# Patient Record
Sex: Female | Born: 1965 | Race: White | Hispanic: Yes | Marital: Single | Smoking: Never smoker
Health system: Southern US, Community
[De-identification: ages and names within clinical notes are randomized; demographics above are authoritative.]

## PROBLEM LIST (undated history)

## (undated) DIAGNOSIS — F329 Major depressive disorder, single episode, unspecified: Secondary | ICD-10-CM

## (undated) DIAGNOSIS — K219 Gastro-esophageal reflux disease without esophagitis: Secondary | ICD-10-CM

## (undated) DIAGNOSIS — E785 Hyperlipidemia, unspecified: Secondary | ICD-10-CM

## (undated) DIAGNOSIS — F32A Depression, unspecified: Secondary | ICD-10-CM

## (undated) DIAGNOSIS — R339 Retention of urine, unspecified: Secondary | ICD-10-CM

## (undated) HISTORY — PX: CARPAL TUNNEL RELEASE: SHX101

---

## 2005-11-09 HISTORY — PX: ABDOMINAL HYSTERECTOMY: SHX81

## 2013-05-17 ENCOUNTER — Encounter (HOSPITAL_COMMUNITY): Payer: Self-pay

## 2013-05-28 ENCOUNTER — Encounter (HOSPITAL_COMMUNITY): Payer: Self-pay

## 2013-05-28 ENCOUNTER — Ambulatory Visit (HOSPITAL_COMMUNITY)
Admission: RE | Admit: 2013-05-28 | Discharge: 2013-05-28 | Disposition: A | Discharge status: Home / Self Care | Payer: Self-pay | Source: Ambulatory Visit | Attending: Obstetrics and Gynecology | Admitting: Obstetrics and Gynecology

## 2013-05-28 ENCOUNTER — Encounter (INDEPENDENT_AMBULATORY_CARE_PROVIDER_SITE_OTHER): Payer: Self-pay

## 2013-05-28 VITALS — BP 110/76 | Temp 98.5°F | Ht 66.0 in | Wt 173.4 lb

## 2013-05-28 DIAGNOSIS — N6325 Unspecified lump in the left breast, overlapping quadrants: Secondary | ICD-10-CM | POA: Insufficient documentation

## 2013-05-28 DIAGNOSIS — Z1239 Encounter for other screening for malignant neoplasm of breast: Secondary | ICD-10-CM

## 2013-05-28 DIAGNOSIS — N6320 Unspecified lump in the left breast, unspecified quadrant: Secondary | ICD-10-CM

## 2013-05-28 NOTE — Progress Notes (Signed)
Patient referred to [facility] by [facility] due to recommending a left breast biopsy.  Pap Smear:  Pap smear not completed today. Last Pap smear was 11/09/2005 and normal. Per patient has no history of an abnormal Pap smear. Patient has a history of a hysterectomy for DUB. Patient does not need any further Pap smears due to her history of a hysterectomy for benign reasons per ACOG and BCCCP guidelines. No Pap smear results in EPIC.  Physical exam: Breasts Breasts symmetrical. No skin abnormalities bilateral breasts. No nipple retraction bilateral breasts. No nipple discharge bilateral breasts. No lymphadenopathy. No lumps palpated right breast. Palpated a lump within the left breast at 12 o'clock above the areola. No complaints of pain or tenderness on exam. Referred patient to [facility] for a left breast biopsy per recommendation. Appointment scheduled for Wednesday, May 29, 2013 at 1300.    Pelvic/Bimanual No Pap smear completed today since patient has a history of a hysterectomy for benign reasons. Pap smear not indicated per BCCCP guidelines.

## 2013-05-28 NOTE — Patient Instructions (Signed)
Taught Heidi MuttonDanelia Compton how to perform BSE and gave educational materials to take home. Patient did not need a Pap smear today due to a history of a hysterectomy for benign reasons. Told patient that she does not need any further Pap smears due to her history of a hysterectomy for benign reasons. Referred [facility] to [facility] for a left breast biopsy per recommendation. Appointment scheduled for Wednesday, May 29, 2013 at 1300. Patient aware of appointment and will be there. Heidi Compton verbalized understanding.  [name], RN 4:08 PM

## 2013-05-29 ENCOUNTER — Other Ambulatory Visit: Payer: Self-pay | Admitting: Radiology

## 2013-05-31 ENCOUNTER — Other Ambulatory Visit: Payer: Self-pay

## 2013-05-31 ENCOUNTER — Ambulatory Visit (HOSPITAL_BASED_OUTPATIENT_CLINIC_OR_DEPARTMENT_OTHER): Payer: Self-pay

## 2013-05-31 VITALS — BP 110/74 | HR 68 | Temp 98.1°F | Resp 14 | Ht 65.0 in | Wt 171.2 lb

## 2013-05-31 DIAGNOSIS — Z0000 Encounter for general adult medical examination without abnormal findings: Secondary | ICD-10-CM

## 2013-05-31 LAB — LIPID PANEL
CHOL/HDL RATIO: 4.6 ratio
CHOLESTEROL: 187 mg/dL (ref 0–200)
HDL: 41 mg/dL (ref >39)
LDL Cholesterol: 122 mg/dL — ABNORMAL HIGH (ref 0–99)
Triglycerides: 118 mg/dL (ref <150)
VLDL: 24 mg/dL (ref 0–40)

## 2013-05-31 LAB — HEMOGLOBIN A1C
Hgb A1c MFr Bld: 5.6 % (ref <5.7)
Mean Plasma Glucose: 114 mg/dL (ref <117)

## 2013-05-31 LAB — GLUCOSE (CC13): Glucose: 101 mg/dl (ref 70–140)

## 2013-05-31 NOTE — Progress Notes (Signed)
Patient is a new patient to the [facility]woman program and is currently a BCCCP patient effective 05/28/2013   Clinical Measurements: Patient is 5 ft. 5 inches, weight 171.2 lbs, waist circumference 34 inches, and hip circumference 41 inches.   Medical History: Patient has no history of high cholesterolor diabetes . Patient does have a history of hypertension but at the present time it is controlled by behavior modification . Per patient no diagnosed history of coronary heart disease, heart attack, heart failure, stroke/TIA, vascular disease or congenital heart defects.  Blood Pressure, Self-measurement: Patient states daughter checks patients Blood pressure a few times a week.  Nutrition Assessment: Patient stated that eats one fruit every day. Patient states she eats at least 1 servings of vegetables a day. Patient does not eats 3 or more ounces of whole grains daily. Patient doesn't eat two or more servings of fish weekly.  Patient states she does drink more than 36 ounces or 450 calories of beverages with added sugars weekly. Patient states she drinks two sodas a day. Patient stated she does not watch her salt intake.  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 Patient does approximately 780 minutes of moderate physical activity a week. Per patient does 240 minutes a week of vigorous activity a week which include cardio exercising and dancing.  Smoking Status: Patient has never smoked. Patient has no  exposure to smoke   Quality of Life Assessment: In assessing patient's quality of life she stated that out of the past 30 days that she has felt her health is good all of them. Patient also stated that in the past 30 days that her mental health is not good including stress, depression and problems with emotions for 15 days. Patient did state that out of the past 30 days she felt her physical or mental health had not kept  her from doing her usual activities including self-care, work or recreation.   Plan: Lab work will be done today including a lipid panel, blood glucose, and Hgb A1C. Will call lab results on next week. Patient  Will call or go to [facility] for counseling and medication for depression.

## 2013-05-31 NOTE — Patient Instructions (Signed)
Discussed health assessment with patient. She will be called with results of lab work and we will then discuss date for [employer].Will contact [facility] concerning depression.  Patient verbalized understanding.

## 2013-06-04 ENCOUNTER — Telehealth: Payer: Self-pay

## 2013-06-04 ENCOUNTER — Encounter (HOSPITAL_COMMUNITY): Payer: Self-pay

## 2013-06-04 NOTE — Telephone Encounter (Signed)
Told to return call and I would call back later to give lab results.

## 2013-10-31 ENCOUNTER — Telehealth (HOSPITAL_COMMUNITY): Payer: Self-pay | Admitting: *Deleted

## 2013-10-31 NOTE — Telephone Encounter (Signed)
Telephoned patient and advised it was time to schedule [facility] with [facility]. Will schedule appointment and call patient back. Used interpreter Delorise RoyalsJulie Sowell.

## 2013-11-11 ENCOUNTER — Encounter (HOSPITAL_COMMUNITY): Payer: Self-pay

## 2013-12-17 ENCOUNTER — Encounter (HOSPITAL_COMMUNITY): Payer: Self-pay | Admitting: *Deleted

## 2014-08-04 ENCOUNTER — Telehealth: Payer: Self-pay

## 2014-08-04 NOTE — Telephone Encounter (Signed)
Called to see if BCCCP card was still valid because patient stated that had scheduled a mammogram at breast center. Informed that card expired in May and would need new one to pay for mammogram. Patient stated that was not having any problems with breast. I explained that we were not doing routine screenings until our provider returned from medical leave. I would put her on a list for Sabrina to call when a screening appointment is available.

## 2014-08-21 ENCOUNTER — Other Ambulatory Visit (HOSPITAL_COMMUNITY): Payer: Self-pay | Admitting: *Deleted

## 2014-08-26 ENCOUNTER — Ambulatory Visit (HOSPITAL_COMMUNITY)
Admission: RE | Admit: 2014-08-26 | Discharge: 2014-08-26 | Disposition: A | Discharge status: Home / Self Care | Payer: Self-pay | Source: Ambulatory Visit | Attending: Obstetrics and Gynecology | Admitting: Obstetrics and Gynecology

## 2014-08-26 ENCOUNTER — Encounter (HOSPITAL_COMMUNITY): Payer: Self-pay

## 2014-08-26 VITALS — BP 114/72 | Temp 98.0°F | Ht 66.0 in | Wt 180.0 lb

## 2014-08-26 DIAGNOSIS — N6320 Unspecified lump in the left breast, unspecified quadrant: Secondary | ICD-10-CM

## 2014-08-26 HISTORY — DX: Gastro-esophageal reflux disease without esophagitis: K21.9

## 2014-08-26 HISTORY — DX: Major depressive disorder, single episode, unspecified: F32.9

## 2014-08-26 HISTORY — DX: Depression, unspecified: F32.A

## 2014-08-26 NOTE — Progress Notes (Signed)
CLINIC:   Breast & Cervical Cancer Control Program (BCCCP) Clinic  REASON FOR VISIT: Well-woman exam with routine gynecological exam  HISTORY OF PRESENT ILLNESS:   Heidi Compton is a 49 y.o. female [facility]. Accompanied by interpreter, Heidi Compton. Her last mammogram was in May 2015 which showed fibrocystic changes. Had a breast ultrasound in November 2015 which was negative. Her last pap smear was in October 2007 and was negative. No history of abnormal pap. 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    REVIEW OF SYSTEMS:   Has pain to her right breast intermittently. Denies nodularity, skin changes, nipple inversion, or nipple discharge bilaterally.  Denies any pelvic pain, pressure, or abnormal vaginal bleeding.   ALLERGIES: No Known Allergies   MEDICATIONS:  Current outpatient prescriptions:  .  lansoprazole (PREVACID) 15 MG capsule, Take 15 mg by mouth daily at 12 noon., Disp: , Rfl:  .  naproxen sodium (ANAPROX) 220 MG tablet, Take 220 mg by mouth 2 (two) times daily with a meal., Disp: , Rfl:    PHYSICAL EXAM:   Patient Vitals for the past 24 hrs:  BP Temp Temp src Height Weight  08/26/14 0855 114/72 mmHg 98 F (36.7 C) Oral  (1.676 m) 180 lb (81.647 kg)   General: Well-nourished, well-appearing female in no acute distress.  She is accompanied by interpreter today.  Stoney Bang, LPN was present during physical exam for this patient.   Breasts: Bilateral breasts exposed and observed with patient standing (arms at side, arms on hips, arms on hips flexed forward, and arms over head).  No gross abnormalities including breast skin puckering or dimpling noted on observation.  Breasts symmetrical without evidence of skin redness, thickening, or peau d'orange appearance. No nipple retraction or nipple discharge noted bilaterally.  No breast nodularity palpated in bilateral breasts.  Axillary lymph nodes: No  axillary lymphadenopathy bilaterally.      ASSESSMENT & PLAN:   1. Breast cancer screening: Heidi Compton has no palpable breast abnormalities on her clinical breast exam today.  She will receive her diagnostic mammogram as scheduled.  She will be contacted by [facility] for results of the mammogram. She was given instructions and educational materials regarding breast self-awareness. Heidi Compton is aware of this plan and agrees with it.   2. Cervical cancer screening: Heidi Compton is s/p hysterectomy. Cervical cancer screening not indicated.     Heidi Compton was encouraged to ask questions and all questions were answered to her satisfaction.      Clenton Pare, DNP, AGPCNP-BC, [facility] [phone]

## 2014-10-29 ENCOUNTER — Encounter (HOSPITAL_COMMUNITY): Payer: Self-pay | Admitting: *Deleted

## 2014-11-26 NOTE — Addendum Note (Signed)
Encounter addended by: Priscille Heidelberghristine P Lakeya Mulka, RN on: 11/26/2014  4:18 PM<BR>     Documentation filed: Charges VN

## 2015-01-11 HISTORY — PX: BREAST BIOPSY: SHX20

## 2015-08-17 ENCOUNTER — Telehealth (HOSPITAL_COMMUNITY): Payer: Self-pay | Admitting: *Deleted

## 2015-08-17 NOTE — Telephone Encounter (Signed)
Patient's daughter called to schedule patient appointment with BCCCP. Called patient back with Spanish interpreter Raquel Mora and left voicemail for her to call back. Patient called me back. Explained BCCCP to patient and verified eligibility. Scheduled patient appointment with BCCCP for Thursday, September 10, 2015 at 1330. Patient verbalized understanding.

## 2015-09-09 ENCOUNTER — Telehealth (HOSPITAL_COMMUNITY): Payer: Self-pay | Admitting: *Deleted

## 2015-09-09 NOTE — Telephone Encounter (Signed)
Telephoned patient at home number and confirmed BCCCP appointment for Thursday August 31 1:30. Used interpreter Delorise RoyalsJulie Sowell.

## 2015-09-10 ENCOUNTER — Ambulatory Visit (HOSPITAL_COMMUNITY)
Admission: RE | Admit: 2015-09-10 | Discharge: 2015-09-10 | Disposition: A | Discharge status: Home / Self Care | Payer: Self-pay | Source: Ambulatory Visit | Attending: Obstetrics and Gynecology | Admitting: Obstetrics and Gynecology

## 2015-09-10 ENCOUNTER — Encounter (HOSPITAL_COMMUNITY): Payer: Self-pay

## 2015-09-10 VITALS — BP 104/64 | Temp 98.0°F | Ht 65.0 in | Wt 182.4 lb

## 2015-09-10 DIAGNOSIS — N644 Mastodynia: Secondary | ICD-10-CM

## 2015-09-10 DIAGNOSIS — Z1239 Encounter for other screening for malignant neoplasm of breast: Secondary | ICD-10-CM

## 2015-09-10 NOTE — Progress Notes (Signed)
bComplaints of bilateral breast pain x 6-7 months that is constant. Patient rates pain at a 9 out of 10. Patient complained of bilateral breasts feeling swollen at times lasting 1-1.5 weeks.  Pap Smear:  Pap smear not completed today. Last Pap smear was 11/09/2005 and normal. Per patient has no history of an abnormal Pap smear. Patient has a history of a hysterectomy in 2007 for DUB. No further Pap smears are recommended due to her history of a hysterectomy for benign reasons per BCCCP and ACOG guidelines.Last Pap smear result is in EPIC.    Physical exam: Breasts Breasts symmetrical. No skin abnormalities bilateral breasts. No nipple retraction bilateral breasts. No nipple discharge bilateral breasts. No lymphadenopathy. No lumps palpated bilateral breasts. Complaints of bilateral breast tenderness around 12 o'clock on exam. [facility]. Appointment scheduled for Thursday, September 10, 2015 at 1500.        Pelvic/Bimanual No Pap smear completed today since patient has a history of a hysterectomy for benign reasons. Pap smear not indicated per BCCCP guidelines.   Smoking History: Patient has never smoked.  Patient Navigation: Patient education provided. Access to services provided for [facility] through [facility]. Spanish interpreter provided.  Colorectal Cancer Screening: Per patient had a colonoscopy completed 15 years ago. Informed patient that it is time to have another one completed since it has been greater than 10 years. No complaints today.  Used Spanish interpreter [employer] Mora from [facility].

## 2015-09-10 NOTE — Patient Instructions (Signed)
Explained breast self awareness to Heidi Compton. Patient did not need a Pap smear today due to her history of a hysterectomy for benign reasons. Let patient know that she doesn't need any further Pap smears due to her history of a hysterectomy for benign reasons. [facility] mamm[facility]. Appointment scheduled for Thursday, September 10, 2015 at 1500. Heidi Muttonanelia Petruzzi verbalized understanding.  Eyanna Mcgonagle, Kathaleen Maserhristine Poll, RN 9:12 PM

## 2015-09-11 ENCOUNTER — Encounter (HOSPITAL_COMMUNITY): Payer: Self-pay | Admitting: *Deleted

## 2015-09-22 ENCOUNTER — Encounter (HOSPITAL_COMMUNITY): Payer: Self-pay | Admitting: *Deleted

## 2016-09-20 ENCOUNTER — Encounter (HOSPITAL_COMMUNITY): Payer: Self-pay

## 2016-09-20 ENCOUNTER — Ambulatory Visit (HOSPITAL_COMMUNITY)
Admission: RE | Admit: 2016-09-20 | Discharge: 2016-09-20 | Disposition: A | Discharge status: Home / Self Care | Payer: Self-pay | Source: Ambulatory Visit | Attending: Obstetrics and Gynecology | Admitting: Obstetrics and Gynecology

## 2016-09-20 VITALS — BP 123/82 | HR 68 | Temp 98.3°F | Ht 66.0 in | Wt 176.1 lb

## 2016-09-20 DIAGNOSIS — N644 Mastodynia: Secondary | ICD-10-CM

## 2016-09-20 DIAGNOSIS — Z1239 Encounter for other screening for malignant neoplasm of breast: Secondary | ICD-10-CM

## 2016-09-20 NOTE — Patient Instructions (Addendum)
Explained breast self awareness to Heidi Compton. Patient did not need a Pap smear today due to her history of a hysterectomy for benign reasons. Let patient know that she doesn't need any further Pap smears due to her history of a hysterectomy for benign reasons. [facility] for a screening mammogram due to no new concerns since last mammogram 09/10/2015. Appointment scheduled for Tuesday, September 20, 2016 at 1000.  Heidi Muttonanelia Riggle verbalized understanding.  Francetta Ilg, Kathaleen Maserhristine Poll, RN 11:16 AM

## 2016-09-20 NOTE — Progress Notes (Signed)
Complaints of bilateral breast pain x 1.5 years that is constant. Patient rates pain at a 8 out of 10.   Pap Smear: Pap smear not completed today. Last Pap smear was 11/09/2005 and normal. Per patient has no history of an abnormal Pap smear. Patient has a history of a hysterectomy in 2007 for DUB. No further Pap smears are recommended due to her history of a hysterectomy for benign reasons per BCCCP and ACOG guidelines.Last Pap smear result is in EPIC.  Physical exam: Breasts Breasts symmetrical. No skin abnormalities bilateral breasts. No nipple retraction bilateral breasts. No nipple discharge bilateral breasts. No lymphadenopathy. No lumps palpated bilateral breasts. Complaints of bilateral breast tenderness around 12 o'clock on exam. [facility] for a screening mammogram due to no new concerns since last mammogram 09/10/2015. Appointment scheduled for Tuesday, September 20, 2016 at 1000.           Pelvic/Bimanual No Pap smear completed today since patient has a history of a hysterectomy for benign reasons. Pap smear not indicated per BCCCP guidelines.   Smoking History: Patient has never smoked.  Patient Navigation: Patient education provided. Access to services provided for [facility] through [facility]. Spanish interpreter provided.  Colorectal Cancer Screening: Per patient had a colonoscopy completed 15 years ago. No complaints today. FIT Test given to patient to complete and return to BCCCP.  Used Spanish interpreter [employer]anca Lindner from [place].

## 2016-12-22 ENCOUNTER — Telehealth (HOSPITAL_COMMUNITY): Payer: Self-pay

## 2016-12-22 NOTE — Telephone Encounter (Signed)
Patient's daughter Donata ClayKarina called about the FIT Test letter that we sent the patient in November. I explained to the patient's daughter that the letter was a reminder letter to complete and return the FIT test. Patient has lost the first FIT Test that was given to her in BCCCP. I let her know that I would mail a new one to her to complete. Patient's daughter voiced understanding and said that she would explain this to her mother.

## 2017-01-02 ENCOUNTER — Other Ambulatory Visit: Payer: Self-pay | Admitting: Obstetrics and Gynecology

## 2017-01-13 LAB — FECAL OCCULT BLOOD, IMMUNOCHEMICAL: FECAL OCCULT BLD: NEGATIVE

## 2017-07-28 ENCOUNTER — Other Ambulatory Visit (HOSPITAL_COMMUNITY): Payer: Self-pay | Admitting: *Deleted

## 2017-07-28 DIAGNOSIS — N644 Mastodynia: Secondary | ICD-10-CM

## 2017-09-21 ENCOUNTER — Ambulatory Visit (HOSPITAL_COMMUNITY)
Admission: RE | Admit: 2017-09-21 | Discharge: 2017-09-21 | Disposition: A | Discharge status: Home / Self Care | Payer: Self-pay | Source: Ambulatory Visit | Attending: Obstetrics and Gynecology | Admitting: Obstetrics and Gynecology

## 2017-09-21 ENCOUNTER — Encounter (HOSPITAL_COMMUNITY): Payer: Self-pay

## 2017-09-21 ENCOUNTER — Ambulatory Visit
Admission: RE | Admit: 2017-09-21 | Discharge: 2017-09-21 | Disposition: A | Discharge status: Home / Self Care | Payer: No Typology Code available for payment source | Source: Ambulatory Visit | Attending: Obstetrics and Gynecology | Admitting: Obstetrics and Gynecology

## 2017-09-21 ENCOUNTER — Ambulatory Visit: Payer: No Typology Code available for payment source

## 2017-09-21 ENCOUNTER — Other Ambulatory Visit (HOSPITAL_COMMUNITY): Payer: Self-pay | Admitting: Obstetrics and Gynecology

## 2017-09-21 VITALS — BP 124/86

## 2017-09-21 DIAGNOSIS — Z1239 Encounter for other screening for malignant neoplasm of breast: Secondary | ICD-10-CM

## 2017-09-21 DIAGNOSIS — Z1231 Encounter for screening mammogram for malignant neoplasm of breast: Secondary | ICD-10-CM

## 2017-09-21 DIAGNOSIS — N644 Mastodynia: Secondary | ICD-10-CM

## 2017-09-21 DIAGNOSIS — N6321 Unspecified lump in the left breast, upper outer quadrant: Secondary | ICD-10-CM

## 2017-09-21 NOTE — Patient Instructions (Signed)
Explained breast self awareness to Heidi Compton. Patient did not need a Pap smear today due to her history of a hysterectomy for benign reasons. Let patient know that she doesn't need any further Pap smears due to her history of a hysterectomy for benign reasons. [facility] for a diagnostic mamm[facility] and possible left breast ultrasound. Appointment scheduled for Thursday, September 21, 2017 at 1050.  Heidi Compton verbalized understanding.  Brannock, Kathaleen Maserhristine Poll, RN 9:29 AM

## 2017-09-21 NOTE — Progress Notes (Signed)
Complaints of left breast lump x 3 years that has increased in size. Patient complained of bilateral diffuse breast pain x 2 years that comes and goes. Patient rates the pain at a 7 out of 10.  Pap Smear: Pap smear not completed today. Last Pap smear was 11/09/2005 and normal. Per patient has no history of an abnormal Pap smear. Patient has a history of a hysterectomy in 2007 for DUB. No further Pap smears are recommended due to her history of a hysterectomy for benign reasons per BCCCP and ACOG guidelines. Last Pap smear result is in EPIC.  Physical exam: Breasts Breasts symmetrical. No skin abnormalities bilateral breasts. No nipple retraction bilateral breasts. No nipple discharge bilateral breasts. No lymphadenopathy. No lumps palpated right breast. Palpated a lump within the left breast at 1 o'clock 4 cm from the nipple. No complaints of pain or tenderness on exam. [facility] for a diagnostic mammogram and possible left breast ultrasound. Appointment scheduled for Thursday, September 21, 2017 at 1050.         Pelvic/Bimanual No Pap smear completed today since patient has a history of a hysterectomy for benign reasons. Pap smear not indicated per BCCCP guidelines.   Smoking History: Patient has never smoked.  Patient Navigation: Patient education provided. Access to services provided for [facility]. Spanish interpreter provided.   Colorectal Cancer Screening: Per patient had a colonoscopy completed 16 years ago. FIT Test completed 01/02/2017 that was negative. No complaints today.   Breast and Cervical Cancer Risk Assessment: Patient has no family history of breast cancer, known genetic mutations, or radiation treatment to the chest before age 53. Patient has no history of cervical dysplasia, immunocompromised, or DES exposure in-utero.  Risk Assessment    Risk Scores      09/21/2017   Last edited by: Lynnell DikeHolland, Sabrina H, LPN   5-year risk: 0.6 %   Lifetime risk: 4.7 %         Used Spanish interpreter Natale LayErika McReynolds from [facility].

## 2017-09-22 ENCOUNTER — Encounter (HOSPITAL_COMMUNITY): Payer: Self-pay | Admitting: *Deleted

## 2017-09-29 ENCOUNTER — Other Ambulatory Visit (HOSPITAL_COMMUNITY): Payer: Self-pay | Admitting: *Deleted

## 2017-09-29 ENCOUNTER — Inpatient Hospital Stay: Payer: No Typology Code available for payment source

## 2017-09-29 ENCOUNTER — Inpatient Hospital Stay: Payer: No Typology Code available for payment source | Attending: Obstetrics and Gynecology

## 2017-09-29 DIAGNOSIS — Z0000 Encounter for general adult medical examination without abnormal findings: Secondary | ICD-10-CM | POA: Insufficient documentation

## 2017-09-29 LAB — LIPID PANEL
Cholesterol: 232 mg/dL — ABNORMAL HIGH (ref 0–200)
HDL: 45 mg/dL (ref >40)
LDL CALC: 165 mg/dL — AB (ref 0–99)
TRIGLYCERIDES: 112 mg/dL (ref <150)
Total CHOL/HDL Ratio: 5.2 RATIO
VLDL: 22 mg/dL (ref 0–40)

## 2017-09-29 LAB — HEMOGLOBIN A1C
Hgb A1c MFr Bld: 6 % — ABNORMAL HIGH (ref 4.8–5.6)
Mean Plasma Glucose: 125.5 mg/dL

## 2017-09-29 NOTE — Progress Notes (Unsigned)
Wisewoman initial screening  Spanish interpreter- Grayce SessionsBlanca Linder,from [facility]  Clinical Measurement:  Height: 65in Weight: 176lb  Blood Pressure: 125/72  Blood Pressure #2: 110/70  Fasting Labs Drawn Today, will review with patient when they result.  Medical History:  Patient states that she has not been diagnosed with high cholesterol, high blood pressure, diabetes or heart disease.  Medications:  Patients states she is not taking any medications for high cholesterol, high blood pressure or diabetes.  She is not taking aspirin daily to prevent heart attack or stroke.    Blood pressure, self measurement:  Patients states she does measure blood pressure at home once per month.  Nutrition:  Patient states she eats 2 cups of fruit and 0.5cups of vegetables in an average day.  Patient states she does not eat fish regularly, she eats about a  half a serving of whole grains daily. She drinks less than 36 ounces of beverages with added sugar weekly.  She is currently watching her sodium intake.  She has not had any drinks containing alcohol in the last seven days.     Physical activity:  Patient states that she gets 210 minutes of moderate exercise in a week.  She gets 0 minutes of vigorous exercise per week.    Smoking status:  Patient states she has never smoked and is not around any smokers.    Quality of life:  Patient states that she has had 0 bad physical days out of the last 30 days. In the last 2 weeks, she has had a few days that she has felt down or depressed. She has had 0 days in the last 2 weeks that she has had little interest or pleasure in doing things.  Risk reduction and counseling:  Patient states she wants to lose weight and increase fruit and vegetable intake.  I encouraged her to continue with current exercise regimen and increase vegetable and fruit intake.  Navigation:  I will notify patient of lab results.  Patient is aware of 2 more health coaching sessions and a follow up.

## 2017-09-29 NOTE — Addendum Note (Signed)
Addended by: Donia GuilesPLEASANT, Lianah Peed on: 09/29/2017 09:13 AM   Modules accepted: Orders

## 2017-09-29 NOTE — Addendum Note (Signed)
Addended by: Donia GuilesPLEASANT, Gerardine Peltz on: 09/29/2017 09:14 AM   Modules accepted: Orders

## 2017-10-04 ENCOUNTER — Telehealth (HOSPITAL_COMMUNITY): Payer: Self-pay | Admitting: *Deleted

## 2017-10-04 NOTE — Telephone Encounter (Signed)
Health coaching 2  Spanish interpreter-Julie Sowell  Labs-cholesterol 232, LDL cholesterol 165, triglycerides 112, HDL cholesterol 45, hemoglobin A1C 6.0, mean plasma glucose 125.5  Patient understands and is aware of her lab results.  Goals-  Patient states she wants to lose weight and is walking 30 minutes x days per week.  I encouraged patient to eat lean meats, 5 fruits and vegetables, drink 8 glasses of water and exercise 150 minutes weekly.  I also encouraged her to increase fiber .  Navigation:  Patient is aware of 1 more health coaching sessions and a follow up. Patient has an appointment at [facility] on Monday, September 30 at 10:15am  Time- 10 minutes

## 2017-10-09 ENCOUNTER — Telehealth: Payer: Self-pay | Admitting: Internal Medicine

## 2017-10-09 ENCOUNTER — Encounter: Payer: Self-pay | Admitting: Internal Medicine

## 2017-10-09 ENCOUNTER — Ambulatory Visit (INDEPENDENT_AMBULATORY_CARE_PROVIDER_SITE_OTHER): Payer: Self-pay | Admitting: Internal Medicine

## 2017-10-09 ENCOUNTER — Other Ambulatory Visit: Payer: Self-pay

## 2017-10-09 VITALS — BP 122/78 | HR 69 | Temp 98.2°F | Wt 175.0 lb

## 2017-10-09 DIAGNOSIS — M7711 Lateral epicondylitis, right elbow: Secondary | ICD-10-CM | POA: Insufficient documentation

## 2017-10-09 DIAGNOSIS — Z791 Long term (current) use of non-steroidal anti-inflammatories (NSAID): Secondary | ICD-10-CM

## 2017-10-09 DIAGNOSIS — R42 Dizziness and giddiness: Secondary | ICD-10-CM

## 2017-10-09 DIAGNOSIS — N951 Menopausal and female climacteric states: Secondary | ICD-10-CM

## 2017-10-09 DIAGNOSIS — Z0000 Encounter for general adult medical examination without abnormal findings: Secondary | ICD-10-CM | POA: Insufficient documentation

## 2017-10-09 DIAGNOSIS — Z98891 History of uterine scar from previous surgery: Secondary | ICD-10-CM

## 2017-10-09 DIAGNOSIS — R7303 Prediabetes: Secondary | ICD-10-CM | POA: Insufficient documentation

## 2017-10-09 DIAGNOSIS — Z90710 Acquired absence of both cervix and uterus: Secondary | ICD-10-CM

## 2017-10-09 DIAGNOSIS — F331 Major depressive disorder, recurrent, moderate: Secondary | ICD-10-CM

## 2017-10-09 DIAGNOSIS — E785 Hyperlipidemia, unspecified: Secondary | ICD-10-CM

## 2017-10-09 DIAGNOSIS — N644 Mastodynia: Secondary | ICD-10-CM

## 2017-10-09 DIAGNOSIS — E782 Mixed hyperlipidemia: Secondary | ICD-10-CM

## 2017-10-09 NOTE — Assessment & Plan Note (Signed)
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 Mammograms have been negative (though noted to have dense breast tissue so not optimal).  Plan: --advised use of sports bras, warm/cold compressions, continued decrease in caffeine --f/u in 1 month

## 2017-10-09 NOTE — Patient Instructions (Addendum)
For your breast pain: Try wearing sports bras as they provide support Try cold or warm compressions  For your elbow pain: Take 400mg  Aleve twice daily You can get an elbow splint at the pharmacy  Use ice/heat and massage regularly  For your depression, [facility] provides free counseling in the community. You can call them to set up an appointment. [facility] [address], [place] [address] [phone]  We will check more lab work today for your blood counts and kidney function.   Codo de [place] con rehabilitacin [facility] Consulte al mdico qu ejercicios son seguros para usted. Haga los ejercicios exactamente como se lo haya indicado el mdico y gradelos como se lo hayan indicado. Es normal sentir tirantez, tensin, presin o molestias leves mientras hace estos ejercicios, pero debe detenerse de inmediato si siente dolor repentino o si el dolor empeora. No comience a hacer estos ejercicios hasta que se lo indique el mdico. EJERCICIOS DE [employer] Y AMPLITUD DE MOVIMIENTOS Estos ejercicios calientan los msculos y las articulaciones, y mejoran la movilidad y la flexibilidad del codo. Estos ejercicios tambin ayudan a Engineer, materials, el adormecimiento y el hormigueo. EjercicioA: Estiramiento del extensor de la mueca 1. Extienda el codo izquierdo/derecho con los dedos apuntando hacia abajo. 2. Tire suavemente la palma de la mano izquierda/derecha hacia usted hasta sentir un ligero estiramiento en la parte superior del antebrazo. 3. Para intensificar el estiramiento, lleve la mano izquierda/derecha hacia el lado del meique o el borde externo del Product manager. 4. Mantenga esta posicin durante ___________ segundos. Repita __________ veces. Realice este ejercicio __________ veces al da. Si se lo indic el mdico, repita el estiramiento, pero esta vez con el codo flexionado. EjercicioB: Estiramiento del flexor de la mueca 1. Extienda el codo  izquierdo/derecho y gire la palma [place] arriba. 2. Empuje suavemente con la punta de los dedos y la palma izquierda/derecha hacia atrs, de modo que la [place] se extienda y los dedos apunten hacia el suelo. 3. Debe sentir un ligero estiramiento en la parte interior del antebrazo. 4. Mantenga esta posicin durante ___________ segundos. Repita __________ veces. Realice este ejercicio __________ veces al da. Si se lo indic el mdico, repita el estiramiento, pero esta vez con el codo flexionado. EJERCICIOS DE FORTALECIMIENTO Estos ejercicios fortalecen el codo y le otorgan resistencia. La resistencia es la capacidad de usar los msculos durante un tiempo prolongado, incluso despus de que se cansen. EjercicioC: Extensores de Warden/ranger 1. Sintese con el antebrazo izquierdo/derecho con la palma hacia abajo y completamente apoyado sobre una mesa o encimera. El codo debe estar en reposo y a la altura de los hombros. 2. Deje que la mueca izquierda/derecha se extienda sobre el borde de la superficie. 3. Sostenga sin apretar una pesa de __________ Cheron Schaumann banda de goma para ejercicios en la mano izquierda/derecha. Doble lentamente la mano izquierda/derecha hacia el antebrazo. Si est usando una banda, sostngala en el lugar con la otra mano, para aumentar la resistencia. 4. Mantenga esta posicin durante ___________ segundos. 5. Vuelva lentamente a la posicin inicial. Repita __________ veces. Realice este ejercicio __________ veces al da. EjercicioD: Desviaciones radiales 1. Prese con una pesa de __________ en la mano izquierda/derecha. O bien, sintese sosteniendo una banda de goma para ejercicios con el otro brazo apoyado en una mesa o encimera. Ubique la mano de forma que el dedo pulgar [place]. 2. Eleve la mano hacia el frente de forma que el dedo pulgar apunte [place]  antebrazo o tire hacia arriba la banda de goma. 3. Mantenga esta posicin durante ___________ segundos. 4. Vuelva lentamente  a la posicin inicial. Repita __________ veces. Realice este ejercicio __________ veces al da. EjercicioE: Extensores de Warden/ranger, ejercicio excntrico 1. Sintese con el antebrazo izquierdo/derecho con la palma hacia abajo y completamente apoyado sobre una mesa o encimera. El codo debe estar en reposo y a la altura de los hombros. 2. Si el mdico se lo ha indicado, sostenga una pesa de __________ [employer]. 3. Deje que la mueca izquierda/derecha se extienda sobre el borde de la superficie. 4. Use la otra mano para subir la mano izquierda/derecha hacia el [place]. Mantenga el antebrazo en la mesa. 5. Use solo los msculos de la mano izquierda/derecha para bajar lentamente la mano a la posicin inicial. Repita __________ veces. Realice este ejercicio __________ veces al da. Esta informacin no tiene Theme park manager el consejo del mdico. Asegrese de hacerle al mdico cualquier pregunta que tenga. Document Released: 10/13/2005 Document Revised: 05/13/2014 Document Reviewed: 09/25/2014 Elsevier Interactive Patient Education  [employer].

## 2017-10-09 NOTE — Assessment & Plan Note (Signed)
Patient will need a pap smear; she had a hysterectomy but still has a cervix.   Had negative FIT colorectal cancer screening 12/2016.

## 2017-10-09 NOTE — Assessment & Plan Note (Signed)
PHQ9 score of 13 today. Patient with s/sx consistent with moderate MDD. She denies SI/HI and lists her family as a protective factor.   She declines pharmacotherapy at this time and is interested in starting counseling.  Plan: --given info on reaching out to [facility] for counseling --f/u in 1 month with new PCP

## 2017-10-09 NOTE — Assessment & Plan Note (Signed)
Patient noted to have elevated cholesterol of 232, and LDL 162. ASCVD risk of 2.2% - not indicated to start statin therapy at this time.  Plan: --patient to work on diet and increase in activity --repeat A1c in 1 year and reassess ASCVD risk score

## 2017-10-09 NOTE — Assessment & Plan Note (Signed)
Patient with A1c of 6.0 on recent screen. She has been contacted by nutritionist through [facility] program and is planning on making diet changes as well as increase her activity level.  Plan: --would plan on rechecking annually

## 2017-10-09 NOTE — Progress Notes (Signed)
CC: Elbow pain  HPI:  [name] is a 52 y.o. with a PMH of mastalgia presenting to clinic to establish care and for evaluation and management of mastalgia, elbow pain, depression.  Patient referred through [facility] program to establish with our clinic. She was found to have hypercholesterolemia to 232 with LDL 165; A1c was 6.0 (previous 5.6). She has been contacted by their nutritionist and is planning on increasing activity and diet changes to address these.  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 She denies skin changes or nipple discharge. She has had mammograms including 3D mammogram this month which did not reveal concerning findings, however noted patient has dense breast tissue. She has decreased her caffeine intake w/o change in symptoms yet.  Right lateral epicondylitis: Patient with several month h/o of right lateral elbow pain made worse with movement, grip; most bothersome at night. She denies trauma. She has tried once daily OTC naproxen without much relief. She has not tried anything else. She notes associated decreased grip strength, radiation of pain into the forearm, and intermittent tingling.  MDD: Patient reports a couple months h/o of depressed mood; she had a previous episode after her husband died and was treated with counseling. She states that all her kids are now out of the house which has been a big change for her. She endorses good appetite, but has significant sleep disturbance. She denies SI/HI.   Further, she has been recently diagnosed with vertigo and given exercises to help; she states they have been helpful and symptoms have had some improvement (no longer having nausea and vomiting) but still there.   She endorses hot flashes that are worse at night, associated with some shortness of  breath/upper chest presure when she is lying down flat. She endorses mild pedal swelling on hot days that resolves with elevation. She denies cough.  Family history: Mother with DM Father with HTN and CAD (MI at age 38) Brother with CAD s/p CABG, age of first MI 59  Surgical history: Csection Hysterectomy 2007 Carpal tunnel release 2000s Breast biopsy 2017  Social history: Never smoker, occasional EtOH use, denies prior or current illicit drug use. Lives by herself. 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  Allergies: Denies known drug allergies  Please see problem based Assessment and Plan for status of patients chronic conditions.  Past Medical History:  Diagnosis Date  . Acid reflux   . Depression   . Hypertension     Review of Systems:   Per HPI  Physical Exam:  Vitals:   10/09/17 1020  BP: 122/78  Pulse: 69  Temp: 98.2 F (36.8 C)  TempSrc: Oral  SpO2: 99%  Weight: 175 lb (79.4 kg)   GENERAL- alert, co-operative, appears as stated age, not in any distress. HEENT- oral mucosa appears moist. CARDIAC- RRR, no murmurs, rubs or gallops. RESP- Moving equal volumes of air, and clear to auscultation bilaterally, no wheezes or crackles. ABDOMEN- Soft, nontender, bowel sounds present. NEURO- CN 2-12 grossly intact. EXTREMITIES- pulse 2+ PT/DP, radial, symmetric, no pedal edema. TTP of right lateral epicondyle and along muscle belly of extensor mms. Grip decreased on right but very much limited by pain. Sensation intact but patient notes tingling during testing only no dorsal aspect of the hand (not involving fingers).  SKIN- Warm, dry, no rash or lesion. PSYCH- Normal mood and  affect, appropriate thought content and speech. Tearful when speaking about her depression.  Assessment & Plan:   See Encounters Tab for problem based charting.   Patient discussed with Dr. Fredrich Romans, MD Internal Medicine PGY-3

## 2017-10-09 NOTE — Telephone Encounter (Signed)
Pt needs a note to return work note tomorrow, please faxed to [phone] pls call pt if have any questions [phone]

## 2017-10-09 NOTE — Assessment & Plan Note (Signed)
Patient with s/sx consistent with lateral epicondylitis likely 2/2 overuse at her job. Decreased grip strength seems to be 2/2 pain rather than loss of strength.  Plan: --advised naproxen 400mg  BID --advised use of elbow splint especially when working and at night --advised use of heat/ice and massage --given stretches and exercises

## 2017-10-10 LAB — CBC
HEMOGLOBIN: 12.4 g/dL (ref 11.1–15.9)
Hematocrit: 37.7 % (ref 34.0–46.6)
MCH: 28.4 pg (ref 26.6–33.0)
MCHC: 32.9 g/dL (ref 31.5–35.7)
MCV: 87 fL (ref 79–97)
PLATELETS: 284 10*3/uL (ref 150–450)
RBC: 4.36 x10E6/uL (ref 3.77–5.28)
RDW: 14.2 % (ref 12.3–15.4)
WBC: 6.8 10*3/uL (ref 3.4–10.8)

## 2017-10-10 LAB — BMP8+ANION GAP
Anion Gap: 15 mmol/L (ref 10.0–18.0)
BUN / CREAT RATIO: 25 — AB (ref 9–23)
BUN: 15 mg/dL (ref 6–24)
CALCIUM: 9.6 mg/dL (ref 8.7–10.2)
CHLORIDE: 104 mmol/L (ref 96–106)
CO2: 23 mmol/L (ref 20–29)
Creatinine, Ser: 0.61 mg/dL (ref 0.57–1.00)
GFR calc Af Amer: 120 mL/min/{1.73_m2} (ref >59)
GFR, EST NON AFRICAN AMERICAN: 104 mL/min/{1.73_m2} (ref >59)
GLUCOSE: 93 mg/dL (ref 65–99)
Potassium: 4.1 mmol/L (ref 3.5–5.2)
Sodium: 142 mmol/L (ref 134–144)

## 2017-10-11 NOTE — Progress Notes (Signed)
Internal Medicine Clinic Attending  Case discussed with Dr. Svalina  at the time of the visit.  We reviewed the resident's history and exam and pertinent patient test results.  I agree with the assessment, diagnosis, and plan of care documented in the resident's note.  

## 2017-11-09 ENCOUNTER — Encounter: Payer: No Typology Code available for payment source | Admitting: Internal Medicine

## 2017-11-09 ENCOUNTER — Encounter: Payer: Self-pay | Admitting: Internal Medicine

## 2017-11-16 ENCOUNTER — Ambulatory Visit: Payer: No Typology Code available for payment source

## 2017-11-30 ENCOUNTER — Telehealth (HOSPITAL_COMMUNITY): Payer: Self-pay | Admitting: *Deleted

## 2017-11-30 NOTE — Telephone Encounter (Signed)
Health Coaching 3  Spanish interpreter- [place] Da Ratmiroff, Language resources  Goals- The patient has met her goals by walking 30 minutes daily. Patient states she has increased vegetable and water consumption and has eliminated sodas. Patient states she has started eating healthy fats such as olive oil, canola oil and avocados.  She states that she has lost 2 pounds and BP was last read at 120/70.     New goal- to decrease her weight to 150 lbs and currently she weighs 176lbs.  Patient states she will reach this goal in 6 months.  Barrier to reaching goal- Patient states that she is under a lot of stress, which  In the past has caused her to gain weight.   Strategies to overcome- Patient states that she is going to the gym and seeing a psychologist for the stress.   Navigation:  Patient is aware of  a follow up.  Time- 20 minutes

## 2018-03-02 ENCOUNTER — Telehealth: Payer: Self-pay | Admitting: *Deleted

## 2018-03-02 NOTE — Telephone Encounter (Signed)
[facility] Follow-up  Spanish interpreter- [name]  Medications:  Patients states she is not taking any medications for high cholesterol, high blood pressure or diabetes.  She is not taking aspirin daily to prevent heart attack or stroke.    Blood pressure, self measurement:  Patients states she does not measure blood pressure at home.  Nutrition:  Patient states she eats 2 cups of fruit and 2 cups of vegetables in an average day.  Patient states she does not eat fish regularly, she eats more than half a serving of whole grains daily. She drinks less than 36 ounces of beverages with added sugar weekly.  She is currently watching her sodium intake.  She has not had any drinks containing alcohol in the last seven days.  Patient states that she is increased her consumption of water and has lost 12 pounds since the first time she came to [facility].  I encouraged her to continue with these health habits.  Physical activity:  Patient states that she gets 210 minutes of moderate exercise in a week.  She gets 0 minutes of vigorous exercise per week.   Smoking status:  Patient states she has never smoked and is not around any smokers.  Quality of life:  Patient states that she has had 0 bad physical days out of the last 30 days. In the last 2 weeks, she has had a few days that she has felt down or depressed. She has had a few days in the last 2 weeks that she has had little interest or pleasure in doing things.

## 2018-05-30 ENCOUNTER — Telehealth: Payer: Self-pay

## 2018-05-30 NOTE — Telephone Encounter (Signed)
error 

## 2018-05-30 NOTE — Progress Notes (Signed)
error 

## 2018-08-23 ENCOUNTER — Other Ambulatory Visit (HOSPITAL_COMMUNITY): Payer: Self-pay | Admitting: *Deleted

## 2018-08-23 DIAGNOSIS — Z1231 Encounter for screening mammogram for malignant neoplasm of breast: Secondary | ICD-10-CM

## 2018-09-09 ENCOUNTER — Encounter: Payer: Self-pay | Admitting: Emergency Medicine

## 2018-09-09 ENCOUNTER — Other Ambulatory Visit: Payer: Self-pay

## 2018-09-09 ENCOUNTER — Ambulatory Visit
Admission: EM | Admit: 2018-09-09 | Discharge: 2018-09-09 | Disposition: A | Discharge status: Home / Self Care | Payer: Self-pay | Attending: Emergency Medicine | Admitting: Emergency Medicine

## 2018-09-09 ENCOUNTER — Ambulatory Visit: Payer: Self-pay

## 2018-09-09 ENCOUNTER — Ambulatory Visit (INDEPENDENT_AMBULATORY_CARE_PROVIDER_SITE_OTHER): Payer: Self-pay

## 2018-09-09 DIAGNOSIS — Y9341 Activity, dancing: Secondary | ICD-10-CM

## 2018-09-09 DIAGNOSIS — S82831A Other fracture of upper and lower end of right fibula, initial encounter for closed fracture: Secondary | ICD-10-CM

## 2018-09-09 DIAGNOSIS — W1839XA Other fall on same level, initial encounter: Secondary | ICD-10-CM

## 2018-09-09 NOTE — ED Triage Notes (Signed)
Per pt she was dancing with her friend and he lifted her and she fell on her right side. Ankle and right knee are hurting and swollen. Cant put very much pressure on her right leg

## 2018-09-09 NOTE — ED Provider Notes (Signed)
[facility]    CSN: [id number] Arrival date & time: 09/09/18  1427      History   Chief Complaint Chief Complaint  Patient presents with  . Ankle Pain    HPI Heidi Compton is a 53 y.o. female presenting for right knee and ankle pain with right ankle swelling.  States that she was dancing with her friend yesterday, she fell on her right side of her ankle, twisting it.  Denies head trauma, loss of consciousness.  Has been taking ibuprofen, icing her ankle and knee.  Having significant difficulty putting weight on her ankle.    Past Medical History:  Diagnosis Date  . Acid reflux   . Depression     Patient Active Problem List   Diagnosis Date Noted  . Hyperlipidemia 10/09/2017  . Prediabetes 10/09/2017  . Mastalgia 10/09/2017  . MDD (major depressive disorder), recurrent episode, moderate (HCC) 10/09/2017  . Right lateral epicondylitis 10/09/2017  . Healthcare maintenance 10/09/2017  . Breast lump on left side at 12 o'clock position 05/28/2013    Past Surgical History:  Procedure Laterality Date  . ABDOMINAL HYSTERECTOMY  11/09/2005  . BREAST BIOPSY Left 2017   benign  . CARPAL TUNNEL RELEASE Right    2000s  . CESAREAN SECTION     one previous    OB History    Gravida  4   Para  4   Term  4   Preterm      AB      Living  5     SAB      TAB      Ectopic      Multiple  1   Live Births               Home Medications    Prior to Admission medications   Medication Sig Start Date End Date Taking? Authorizing Provider  lansoprazole (PREVACID) 15 MG capsule Take 15 mg by mouth daily at 12 noon.    [provider]  naproxen sodium (ANAPROX) 220 MG tablet Take 220 mg by mouth 2 (two) times daily with a meal.    [provider]    Family History Family History  Problem Relation Age of Onset  . Hypertension Father   . CAD Father 44  . Diabetes Maternal Aunt   . Diabetes Mother   . Heart disease Brother         CAD, h/o CABG; first MI at age 21    Social History Social History   Tobacco Use  . Smoking status: Never Smoker  . Smokeless tobacco: Never Used  Substance Use Topics  . Alcohol use: Yes    Comment: occassionally  . Drug use: No     Allergies   Patient has no known allergies.   Review of Systems Review of Systems  Constitutional: Negative for fatigue and fever.  Respiratory: Negative for cough and shortness of breath.   Cardiovascular: Negative for chest pain and palpitations.  Musculoskeletal:       Positive for right knee pain, right ankle pain, right ankle swelling Negative for bruising, deformity, numbness  Neurological: Negative for weakness and numbness.     Physical Exam Triage Vital Signs ED Triage Vitals  Enc Vitals Group     BP      Pulse      Resp      Temp      Temp src      SpO2  Weight      Height      Head Circumference      Peak Flow      Pain Score      Pain Loc      Pain Edu?      Excl. in GC?    No data found.  Updated Vital Signs BP 132/86 (BP Location: Right Arm)   Pulse 82   Resp 16   SpO2 97%   Visual Acuity Right Eye Distance:   Left Eye Distance:   Bilateral Distance:    Right Eye Near:   Left Eye Near:    Bilateral Near:     Physical Exam Constitutional:      General: She is not in acute distress. HENT:     Head: Normocephalic and atraumatic.  Eyes:     General: No scleral icterus.    Conjunctiva/sclera: Conjunctivae normal.     Pupils: Pupils are equal, round, and reactive to light.  Cardiovascular:     Rate and Rhythm: Normal rate.  Pulmonary:     Effort: Pulmonary effort is normal. No respiratory distress.  Musculoskeletal:     Comments: Right knee mildly edematous as compared to left without effusion, ecchymosis.  Full active ROM with pain.  Tender over superomedial aspect of knee joint.  Knee stable to valgus and varus stress, though painful when doing so.  Negative anterior drawer test. Right  ankle with significant lateral malleoli swelling and tenderness.  Reduced active ROM second pain/swelling.  Patient able to move toes, cap refill less than 2 seconds.  Sensation mildly decreased.  DP pulses intact bilaterally  Neurological:     General: No focal deficit present.     Mental Status: She is alert.      UC Treatments / Results  Labs (all labs ordered are listed, but only abnormal results are displayed) Labs Reviewed - No data to display  EKG   Radiology Dg Ankle Complete Right  Result Date: 09/09/2018 CLINICAL DATA:  Pain after fall yesterday. EXAM: RIGHT ANKLE - COMPLETE 3+ VIEW COMPARISON:  None. FINDINGS: There is a distal fibular fracture without significant displacement. There is significant overlying soft tissue swelling. The ankle mortise is intact. No other fractures are seen. IMPRESSION: Nondisplaced distal fibular fracture with overlying soft tissue swelling. Electronically Signed   By: Gerome Samavid  Williams III M.D   On: 09/09/2018 15:09   Dg Knee Complete 4 Views Right  Result Date: 09/09/2018 CLINICAL DATA:  Pain after fall yesterday EXAM: RIGHT KNEE - COMPLETE 4+ VIEW COMPARISON:  None. FINDINGS: No evidence of fracture, dislocation, or joint effusion. No evidence of arthropathy or other focal bone abnormality. Soft tissues are unremarkable. IMPRESSION: Negative. Electronically Signed   By: Gerome Samavid  Williams III M.D   On: 09/09/2018 15:10    Procedures Procedures (including critical care time)  Medications Ordered in UC Medications - No data to display  Initial Impression / Assessment and Plan / UC Course  I have reviewed the triage vital signs and the nursing notes.  Pertinent labs & imaging results that were available during my care of the patient were reviewed by me and considered in my medical decision making (see chart for details).     1.  Distal fibular fracture X-ray of right knee done in office, reviewed by radiology: Negative for fracture.  X-ray  of right ankle done in office, reviewed by me and radiology: Positive for close fracture of right distal fibula without displacement.  Consulted Dr. Jena GaussHaddix (  Ortho) who agrees with assessment/plan: Gave patient postop boot and crutches in office which she tolerated well.  Will follow up with Dr. Doreatha Martin in 1 week.  Return precautions discussed, patient verbalized understanding and is agreeable to plan. Final Clinical Impressions(s) / UC Diagnoses   Final diagnoses:  Closed fracture of distal end of right fibula, unspecified fracture morphology, initial encounter     Discharge Instructions     May ice, rest, elevate the area(s) of pain.   May use OTC Tylenol, ibuprofen as needed for pain. You may take off your boot to elevate, ice your ankle. Return if you develop worsening pain, chest pain, difficulty breathing.    ED Prescriptions    None     Controlled Substance Prescriptions [place] Controlled Substance Registry consulted? Not Applicable   Quincy Sheehan, [place] 09/09/18 1630

## 2018-09-09 NOTE — Discharge Instructions (Addendum)
May ice, rest, elevate the area(s) of pain.   May use OTC Tylenol, ibuprofen as needed for pain. You may take off your boot to elevate, ice your ankle. Return if you develop worsening pain, chest pain, difficulty breathing.

## 2018-11-13 ENCOUNTER — Other Ambulatory Visit: Payer: Self-pay

## 2018-11-13 ENCOUNTER — Ambulatory Visit
Admission: RE | Admit: 2018-11-13 | Discharge: 2018-11-13 | Disposition: A | Discharge status: Home / Self Care | Payer: No Typology Code available for payment source | Source: Ambulatory Visit | Attending: Obstetrics and Gynecology | Admitting: Obstetrics and Gynecology

## 2018-11-13 ENCOUNTER — Encounter (HOSPITAL_COMMUNITY): Payer: Self-pay

## 2018-11-13 ENCOUNTER — Ambulatory Visit (HOSPITAL_COMMUNITY)
Admission: RE | Admit: 2018-11-13 | Discharge: 2018-11-13 | Disposition: A | Discharge status: Home / Self Care | Payer: Self-pay | Source: Ambulatory Visit | Attending: Obstetrics and Gynecology | Admitting: Obstetrics and Gynecology

## 2018-11-13 DIAGNOSIS — Z1231 Encounter for screening mammogram for malignant neoplasm of breast: Secondary | ICD-10-CM

## 2018-11-13 DIAGNOSIS — Z1239 Encounter for other screening for malignant neoplasm of breast: Secondary | ICD-10-CM

## 2018-11-13 HISTORY — DX: Retention of urine, unspecified: R33.9

## 2018-11-13 NOTE — Patient Instructions (Signed)
Explained breast self awareness with [name]. Patient did not need a Pap smear today due to her history of a hysterectomy for benign reasons. Let patient know that she doesn't need any further Pap smears due to her history of a hysterectomy for benign reasons. Referred patient to the [facility] for a screening mammogram. Appointment scheduled for Tuesday, November 13, 2018 at 1540. Patient aware of appointment and will be there. Let patient know the [facility] will follow up with her within the next couple weeks with results of her mammogram by letter or phone. [name] verbalized understanding.  Tharun Cappella, Arvil Ch[place], RN 2:00 PM

## 2018-11-13 NOTE — Progress Notes (Signed)
No complaints today.   Pap Smear: Pap smear not completed today. Last Pap smear was 11/09/2005 and normal. Per patient has no history of an abnormal Pap smear. Patient has a history of a hysterectomy in 2007 for DUB. No further Pap smears are recommended due to her history of a hysterectomy for benign reasons per BCCCP and ACOG guidelines. Last Pap smear result is in EPIC.  Physical exam: Breasts Breasts symmetrical. No skin abnormalities bilateral breasts. No nipple retraction bilateral breasts. No nipple discharge bilateral breasts. No lymphadenopathy. No lumps palpated bilateral breasts. No complaints of pain or tenderness on exam. Referred patient to the [facility] for a screening mammogram. Appointment scheduled for Tuesday, November 13, 2018 at 1540.        Pelvic/Bimanual No Pap smear completed today sincepatient has a history of a hysterectomy for benign reasons. Pap smear not indicated per BCCCP guidelines.  Smoking History: Patient has never smoked.  Patient Navigation: Patient education provided. Access to services provided for patient through [facility] program. Spanish interpreter provided.   Colorectal Cancer Screening: Per patient had a colonoscopy completed in 1995. FIT Test completed 01/02/2017 that was negative. No complaints today.   Breast and Cervical Cancer Risk Assessment: Patient has no family history of breast cancer, known genetic mutations, or radiation treatment to the chest before age 19. Patient has no history of cervical dysplasia, immunocompromised, or DES exposure in-utero.  Risk Assessment    Risk Scores      11/13/2018 09/21/2017   Last edited by: Loletta Parish, RN Armond Hang, LPN   5-year risk: 0.6 % 0.6 %   Lifetime risk: 4.6 % 4.7 %         Used Spanish interpreter Rudene Anda from [place].

## 2018-11-15 ENCOUNTER — Other Ambulatory Visit: Payer: Self-pay | Admitting: Obstetrics and Gynecology

## 2018-11-15 DIAGNOSIS — R928 Other abnormal and inconclusive findings on diagnostic imaging of breast: Secondary | ICD-10-CM

## 2018-11-26 ENCOUNTER — Other Ambulatory Visit: Payer: Self-pay | Admitting: Obstetrics and Gynecology

## 2018-11-26 ENCOUNTER — Ambulatory Visit
Admission: RE | Admit: 2018-11-26 | Discharge: 2018-11-26 | Disposition: A | Discharge status: Home / Self Care | Payer: No Typology Code available for payment source | Source: Ambulatory Visit | Attending: Obstetrics and Gynecology | Admitting: Obstetrics and Gynecology

## 2018-11-26 ENCOUNTER — Other Ambulatory Visit: Payer: Self-pay

## 2018-11-26 DIAGNOSIS — R921 Mammographic calcification found on diagnostic imaging of breast: Secondary | ICD-10-CM

## 2018-11-26 DIAGNOSIS — R928 Other abnormal and inconclusive findings on diagnostic imaging of breast: Secondary | ICD-10-CM

## 2018-12-12 ENCOUNTER — Other Ambulatory Visit: Payer: Self-pay | Admitting: Obstetrics and Gynecology

## 2018-12-12 ENCOUNTER — Inpatient Hospital Stay: Payer: Self-pay | Attending: Obstetrics and Gynecology | Admitting: *Deleted

## 2018-12-12 ENCOUNTER — Other Ambulatory Visit: Payer: Self-pay

## 2018-12-12 VITALS — BP 134/90 | Temp 97.7°F | Ht 66.25 in | Wt 173.0 lb

## 2018-12-12 DIAGNOSIS — Z0000 Encounter for general adult medical examination without abnormal findings: Secondary | ICD-10-CM

## 2018-12-12 NOTE — Progress Notes (Signed)
[facility]ise Woman Follow-Up Visit   interpreter- Rudene Anda, [employer]   Clinical Measurement: Height: 66.25 in Weight: 173 lb  Blood Pressure: 132/84  Blood Pressure #2: 134/90 Fasting Labs Drawn Today, will review with patient when they result.   Medical History:  Patient states that she does have a history of  high cholesterol and high blood pressure. Patient does not have a history of diabetes.  Medications:  Patient states that she does not take medication to lower cholesterol, blood pressure or blood sugar. Patient does not take an aspirin a day to help prevent a heart attack or stroke.    Blood pressure, self measurement: Patient states that she does measure blood pressure from home and has not been told to do so by a healthcare provider.    Nutrition: Patient states that on average she eats 1 cup of fruit and 0 cups of vegetables per day. Patient states that she does eat fish at least 2 times per week. Patient eats less than half servings of whole grains. Patient drinks less than 36 ounces of beverages with added sugar weekly. Patient is currently watching sodium or salt intake. In the past 7 days patient has had 1 day where she has had drinks containing alcohol. On an average day that patient drinks alcoholic beverages she consumes 6 drinks containing alcohol.      Physical activity:  Patient states that she gets 150 minutes of moderate and 0 minutes of vigorous physical activity each week.  Smoking status:  Patient states that she has never smoked tobacco.   Quality of life:  Over the past 2 weeks patient states that she has not had any days where she has little interest or pleasure in doing things and several days where she has felt down, depressed or hopeless.    Risk reduction and counseling:    Health Coaching: Encouraged patient to try and add more fruits and vegetables into diet. Explained that the daily recommendation is for 2 cups of fruit and 3 cups of vegetables. Also spoke with  patient about adding more whole grains into diet like whole grain breads, brown rice, whole grain pasta, oatmeal or whole grain cereals.    Navigation:  I will notify patient of lab results.  Patient is aware of 2 more health coaching sessions and a follow up.  Time: 25 minutes

## 2018-12-13 LAB — LIPID PANEL W/O CHOL/HDL RATIO
Cholesterol, Total: 222 mg/dL — ABNORMAL HIGH (ref 100–199)
HDL: 43 mg/dL (ref >39)
LDL Chol Calc (NIH): 140 mg/dL — ABNORMAL HIGH (ref 0–99)
Triglycerides: 214 mg/dL — ABNORMAL HIGH (ref 0–149)
VLDL Cholesterol Cal: 39 mg/dL (ref 5–40)

## 2018-12-13 LAB — GLUCOSE, RANDOM: Glucose: 105 mg/dL — ABNORMAL HIGH (ref 65–99)

## 2018-12-13 LAB — HGB A1C W/O EAG: Hgb A1c MFr Bld: 6.1 % — ABNORMAL HIGH (ref 4.8–5.6)

## 2018-12-18 ENCOUNTER — Telehealth (HOSPITAL_COMMUNITY): Payer: Self-pay

## 2018-12-18 NOTE — Telephone Encounter (Signed)
Left message for patient via interpreter Rudene Anda about Heidi Compton lab results. Will try to call patient back again tomorrow to go over results.

## 2018-12-20 ENCOUNTER — Telehealth (HOSPITAL_COMMUNITY): Payer: Self-pay

## 2018-12-20 NOTE — Telephone Encounter (Signed)
Health coaching 2   interpreter- Rudene Anda, [facility]   Labs-222 cholesterol , 140 LDL cholesterol , 214 triglycerides , 43 HDL cholesterol , 6.1 hemoglobin A1C , 105 mean plasma glucose  Patient understands and is aware of her lab results.   Goals- Discussed lab results in detail with patient and answered any questions that patient had regarding results. Reduce the amount of fried and fatty foods. Reduce the amount of red meat and whole fat dairy consumed. Add in more whole grains and heart healthy fish. Start watching the amount of sweets, sugars and carbs consumed. Encouraged patient to continue with exercise and to try and get 20 minutes of exercise daily.   Navigation:  Patient is aware of 1 more health coaching sessions and a follow up. Will call patient with follow-up appointment information for Internal Medicine once appointment is scheduled.  Time- 16 minutes

## 2019-01-14 ENCOUNTER — Ambulatory Visit (INDEPENDENT_AMBULATORY_CARE_PROVIDER_SITE_OTHER): Payer: Self-pay | Admitting: Internal Medicine

## 2019-01-14 ENCOUNTER — Encounter: Payer: Self-pay | Admitting: Internal Medicine

## 2019-01-14 VITALS — BP 145/83 | HR 69 | Temp 98.4°F | Wt 178.9 lb

## 2019-01-14 DIAGNOSIS — E785 Hyperlipidemia, unspecified: Secondary | ICD-10-CM

## 2019-01-14 DIAGNOSIS — K5900 Constipation, unspecified: Secondary | ICD-10-CM

## 2019-01-14 DIAGNOSIS — E782 Mixed hyperlipidemia: Secondary | ICD-10-CM

## 2019-01-14 DIAGNOSIS — R7303 Prediabetes: Secondary | ICD-10-CM

## 2019-01-14 DIAGNOSIS — R030 Elevated blood-pressure reading, without diagnosis of hypertension: Secondary | ICD-10-CM

## 2019-01-14 NOTE — Patient Instructions (Signed)
Heidi Compton, It was a pleasure meeting you.  Today we discussed your cholesterol. It is elevated, but does not require any medications. Continue to work on healthy diet and exercising several times per week.   For your sugars, you have what is called pre-diabetes. After further discussion, I'd like to try nutrition and healthy lifestyle a little longer before we start you on a medication that would likely be lifelong.   I am giving you some really good resources that should help with making daily diet choices.   Let's plan on seeing you back next year to re-check labs and see how you're doing.   Take care!   Heidi Compton, Fue un placer conocerte. Hoy hablamos de tu colesterol. Est elevado, pero no requiere ningn medicamento. Contine trabajando con una dieta saludable y haciendo ejercicio varias veces por semana.  Para sus azcares, tiene lo que se llama prediabetes. Despus de una discusin ms detallada, me gustara probar la nutricin y el estilo de vida saludable un poco ms antes de comenzar con un medicamento que probablemente sea de por vida.  Le estoy dando algunos recursos realmente buenos que deberan ayudarlo a tomar decisiones sobre la dieta diaria.  Planeamos verte el prximo ao para volver a revisar los laboratorios y ver cmo te est yendo.  Cudate!

## 2019-01-19 ENCOUNTER — Encounter: Payer: Self-pay | Admitting: Internal Medicine

## 2019-01-19 DIAGNOSIS — R030 Elevated blood-pressure reading, without diagnosis of hypertension: Secondary | ICD-10-CM | POA: Insufficient documentation

## 2019-01-19 NOTE — Assessment & Plan Note (Signed)
Patient's blood pressure elevated at today's visit which she attributes to being anxious for today's visit and having to sprint here. She is asymptomatic. Per chart review, she has not had any other elevated readings. Her daughter works in healthcare and will be able to monitor it periodically. Instructed to return if she has persistently elevated readings to consider initiation of therapy.

## 2019-01-19 NOTE — Progress Notes (Signed)
   CC: pre-diabetes, HLD  HPI:  [name] is a 54 y.o. Spanish-speaking female who presents today through Wise woman program for follow-up on pre-diabetes and elevated cholesterol. Please see problem based charting for further details.   Past Medical History:  Diagnosis Date  . Acid reflux   . Depression   . Urinary retention    Review of Systems:  Review of Systems  Constitutional: Negative for chills and fever.  Eyes: Negative for blurred vision.  Respiratory: Negative for shortness of breath.   Cardiovascular: Negative for chest pain, palpitations and leg swelling.  Gastrointestinal: Positive for constipation. Negative for abdominal pain, nausea and vomiting.  Genitourinary: Negative for dysuria.  Musculoskeletal: Negative for falls and joint pain.  Neurological: Negative for dizziness and headaches.  Endo/Heme/Allergies: Negative for polydipsia.  Psychiatric/Behavioral: Negative for depression.     Physical Exam:  Vitals:   01/14/19 1503 01/14/19 1544  BP: (!) 150/84 (!) 145/83  Pulse: 75 69  Temp: 98.4 F (36.9 C)   TempSrc: Oral   SpO2: 98%   Weight: 178 lb 14.4 oz (81.1 kg)    Physical Exam Constitutional:      General: She is not in acute distress.    Appearance: Normal appearance.  Eyes:     Extraocular Movements: Extraocular movements intact.     Conjunctiva/sclera: Conjunctivae normal.  Cardiovascular:     Rate and Rhythm: Normal rate and regular rhythm.     Pulses: Normal pulses.  Pulmonary:     Effort: Pulmonary effort is normal.     Breath sounds: Normal breath sounds.  Abdominal:     General: Bowel sounds are normal. There is no distension.     Palpations: Abdomen is soft.     Tenderness: There is no abdominal tenderness.  Musculoskeletal:     Cervical back: Neck supple.     Right lower leg: No edema.     Left lower leg: No edema.  Lymphadenopathy:     Cervical: No cervical adenopathy.  Skin:    General: Skin is warm and dry.    Neurological:     General: No focal deficit present.     Mental Status: She is alert and oriented to person, place, and time.  Psychiatric:        Mood and Affect: Mood normal.        Behavior: Behavior normal.      Assessment & Plan:   See Encounters Tab for problem based charting.  Patient discussed with Dr. Antony Contras

## 2019-01-19 NOTE — Assessment & Plan Note (Signed)
Patient's lipid panel noted to have elevated cholesterol of 222 and LDL of 140 which is slightly improved from last year's. ASCVD risk 3.6% so no indication to initiate statin therapy at this time. Encouraged to continue regular physical activity and diet modifications. Will re-check in 1 year.

## 2019-01-19 NOTE — Assessment & Plan Note (Signed)
A1C stable at 6.1. She states she has been working on making lifestyle changes. Spanish resources on pre-diabetes and how to keep track of carbs provided today. Will plan to continue with lifestyle modifications and monitor annually.

## 2019-01-21 NOTE — Progress Notes (Signed)
Internal Medicine Clinic Attending  Case discussed with Dr. Bloomfield at the time of the visit.  We reviewed the resident's history and exam and pertinent patient test results.  I agree with the assessment, diagnosis, and plan of care documented in the resident's note.  

## 2019-01-22 ENCOUNTER — Ambulatory Visit: Payer: No Typology Code available for payment source | Attending: Internal Medicine

## 2019-01-22 DIAGNOSIS — Z20822 Contact with and (suspected) exposure to covid-19: Secondary | ICD-10-CM | POA: Insufficient documentation

## 2019-01-23 LAB — NOVEL CORONAVIRUS, NAA: SARS-CoV-2, NAA: NOT DETECTED

## 2019-05-27 ENCOUNTER — Other Ambulatory Visit: Payer: Self-pay

## 2019-05-27 ENCOUNTER — Other Ambulatory Visit: Payer: Self-pay | Admitting: Obstetrics and Gynecology

## 2019-05-27 ENCOUNTER — Ambulatory Visit
Admission: RE | Admit: 2019-05-27 | Discharge: 2019-05-27 | Disposition: A | Discharge status: Home / Self Care | Payer: No Typology Code available for payment source | Source: Ambulatory Visit | Attending: Obstetrics and Gynecology | Admitting: Obstetrics and Gynecology

## 2019-05-27 DIAGNOSIS — R921 Mammographic calcification found on diagnostic imaging of breast: Secondary | ICD-10-CM

## 2019-08-07 ENCOUNTER — Telehealth: Payer: Self-pay

## 2019-08-07 NOTE — Telephone Encounter (Signed)
Left message for patient about completing HC 3 for the Wise Woman program. Left name and number for patient to call back. 

## 2019-10-21 ENCOUNTER — Telehealth: Payer: Self-pay

## 2019-10-21 NOTE — Telephone Encounter (Signed)
error 

## 2019-10-21 NOTE — Telephone Encounter (Addendum)
Health Coaching 3  interpreter-  [name], [facility]   Goals- Patient states that she has cut back on her soda intake greatly. She currently consumes 1 soda a week. Patient states that she has also cut back her carbs also. She no longer eats as much bread, pasta or rice. Patient has cut back on the amount of red meat that she consumes. She has been trying to eat more vegetables and chicken. Patient states that she has not been exercising very much but walks occasionally.    New goal- Try and start walking daily for 20 minutes.  Barrier to reaching goal- NA   Strategies to overcome- NA   Navigation:  Patient is aware of  a follow up session. Patient is scheduled for follow-up visit on Wednesday, November 13, 2019 @ 3:30 pm.   Time- 10 minutes

## 2019-11-05 ENCOUNTER — Other Ambulatory Visit: Payer: Self-pay

## 2019-11-05 ENCOUNTER — Ambulatory Visit: Payer: Self-pay | Admitting: Orthopaedic Surgery

## 2019-11-05 ENCOUNTER — Encounter: Payer: Self-pay | Admitting: Orthopaedic Surgery

## 2019-11-05 DIAGNOSIS — S83241A Other tear of medial meniscus, current injury, right knee, initial encounter: Secondary | ICD-10-CM | POA: Insufficient documentation

## 2019-11-05 MED ORDER — METHYLPREDNISOLONE ACETATE 40 MG/ML IJ SUSP
40.0000 mg | INTRAMUSCULAR | Status: AC | PRN
  Administered 2019-11-05: 40 mg via INTRA_ARTICULAR

## 2019-11-05 MED ORDER — LIDOCAINE HCL 1 % IJ SOLN
2.0000 mL | INTRAMUSCULAR | Status: AC | PRN
  Administered 2019-11-05: 2 mL

## 2019-11-05 MED ORDER — BUPIVACAINE HCL 0.5 % IJ SOLN
2.0000 mL | INTRAMUSCULAR | Status: AC | PRN
  Administered 2019-11-05: 2 mL via INTRA_ARTICULAR

## 2019-11-05 NOTE — Progress Notes (Signed)
Office Visit Note   Patient: Heidi Compton           Date of Birth: Nov 04, 1965           MRN: [id number] Visit Date: 11/05/2019              Requested by: Lenward Chancellor D, DO [address] [place],  [place] [address] PCP: Bridget Hartshorn, DO   Assessment & Plan: Visit Diagnoses:  1. Acute medial meniscal tear, right, initial encounter     Plan: Impression is right knee medial meniscus tear.  Today, we discussed cortisone injection and debriding arthroscopy.  We will first proceed with cortisone injection in hopes of relieving her symptoms.  She will follow up with [place] in 4 weeks time for repeat evaluation.  If at that point she is still symptomatic, we will further discuss surgical intervention.  This was all discussed through Spanish-speaking interpreter.  Follow-Up Instructions: Return in about 4 weeks (around 12/03/2019).   Orders:  No orders of the defined types were placed in this encounter.  No orders of the defined types were placed in this encounter.     Procedures: Large Joint Inj: R knee on 11/05/2019 7:16 PM Indications: pain Details: 22 G needle  Arthrogram: No  Medications: 40 mg methylPREDNISolone acetate 40 MG/ML; 2 mL lidocaine 1 %; 2 mL bupivacaine 0.5 % Consent was given by the patient. Patient was prepped and draped in the usual sterile fashion.       Clinical Data: No additional findings.   Subjective: Chief Complaint  Patient presents with   Right Knee - Pain    HPI patient is a pleasant 54 year old Spanish-speaking female who comes in here today for an injury to her right knee.  Initial injury occurred while at work on 07/31/2019 when she sustained a mechanical fall after tripping over a rug causing her to twist her right knee before hitting the ground.  Of note, this was denied by [employer] and is being handled by an Pensions consultant.  She has had pain to the medial knee since.  She notes catching and locking occasionally  with ambulation.  Flexion of the knee seems to aggravate her symptoms most.  She has been taking naproxen with mild relief of symptoms.  No previous cortisone injection.  She has had a MRI of her right knee and has been seen by Dr. Jena Gauss who referred her to [place] for medial meniscus tear.  Review of Systems as detailed in HPI.  All others reviewed and are negative.   Objective: Vital Signs: There were no vitals taken for this visit.  Physical Exam well-developed well-nourished female no acute distress.  Alert and oriented x3.  Ortho Exam right knee exam shows a trace effusion.  Range of motion 0 to 95 degrees.  Marked tenderness medial joint line.  Positive medial McMurray.  Ligaments are stable.  She is neurovascular intact distally.  Specialty Comments:  No specialty comments available.  Imaging: No new imaging   PMFS History: Patient Active Problem List   Diagnosis Date Noted   Acute medial meniscal tear, right, initial encounter 11/05/2019   Elevated blood-pressure reading, without diagnosis of hypertension 01/19/2019   Screening breast examination 11/13/2018   Hyperlipidemia 10/09/2017   Prediabetes 10/09/2017   Mastalgia 10/09/2017   MDD (major depressive disorder), recurrent episode, moderate (HCC) 10/09/2017   Right lateral epicondylitis 10/09/2017   Healthcare maintenance 10/09/2017   Breast lump on left side at 12 o'clock position 05/28/2013  Past Medical History:  Diagnosis Date   Acid reflux    Depression    Urinary retention     Family History  Problem Relation Age of Onset   Hypertension Father    CAD Father 28   Diabetes Maternal Aunt    Diabetes Mother    Heart disease Brother        CAD, h/o CABG; first MI at age 29   Diabetes Maternal Aunt     Past Surgical History:  Procedure Laterality Date   ABDOMINAL HYSTERECTOMY  11/09/2005   BREAST BIOPSY Left 2017   benign   CARPAL TUNNEL RELEASE Right    2000s   CESAREAN SECTION      one previous   Social History   Occupational History   Not on file  Tobacco Use   Smoking status: Never Smoker   Smokeless tobacco: Never Used  Vaping Use   Vaping Use: Never used  Substance and Sexual Activity   Alcohol use: Yes    Comment: occassionally   Drug use: No   Sexual activity: Yes    Partners: Male    Birth control/protection: Surgical

## 2019-11-13 ENCOUNTER — Other Ambulatory Visit: Payer: Self-pay

## 2019-11-13 ENCOUNTER — Inpatient Hospital Stay: Payer: Self-pay | Attending: Obstetrics and Gynecology | Admitting: *Deleted

## 2019-11-13 VITALS — BP 132/82 | Ht 66.25 in | Wt 175.6 lb

## 2019-11-13 DIAGNOSIS — Z0000 Encounter for general adult medical examination without abnormal findings: Secondary | ICD-10-CM

## 2019-11-13 NOTE — Progress Notes (Signed)
Wisewoman follow up   Interpreter:  [name], [employer]  Clinical Measurement:  There were no vitals filed for this visit.    Medical History:  Patient states that she does not have high cholesterol, does not have high blood pressure and she does not have diabetes.  Medications:  Patient states that she does not take medication to lower cholesterol, blood pressure and blood sugar.  Patient does not take an aspirin a day to help prevent a heart attack or stroke.   Blood pressure, self measurement: Patient states that she does measure blood pressure from home. She checks her blood pressure monthly. She shares her readings with a health care provider: no.   Nutrition: Patient states that on average she eats 2 cups of fruit and 0 cups of vegetables per day. Patient states that she does not eat fish at least 2 times per week. Patient eats about half servings of whole grains. Patient drinks less than 36 ounces of beverages with added sugar weekly: yes. Patient is currently watching sodium or salt intake: yes. In the past 7 days patient has had 1 drinks containing alcohol. On average patient drinks 4 drinks containing alcohol when alcohol is consumed.  Physical activity:  Patient states that she gets 210 minutes of moderate and 0 minutes of vigorous physical activity each week.  Smoking status:  Patient states that she has has never smoked .   Quality of life:  Over the past 2 weeks patient states that she had little interest or pleasure in doing things: not at all. She has been feeling down, depressed or hopeless:several days.    Risk reduction and counseling:  Spoke with patient about adding more vegetables in daily diet. Also spoke with patient about trying to add some heart healthy fish into weekly diet.Patient currently has an injured knee from falling at work and is wearing a knee brace. Patient states that she is not able to exercise as much because of this. Encouraged patient to try and look  for some low impact exercises such as chair exercises that she can do while her knee heals. Encouraged patient to continue watching the amount of fried and fatty foods consumed given her hx of elevated cholesterol. Also encouraged patient to continue watching her sweets and sugars/ carbs consumed given elevated glucose and hemoglobin A1C during initial visit.   Navigation: This was the  follow up session for this patient, I will check up on her progress in the coming months. Patient is scheduled for re-screening on December 18, 2019 @ 9:00 am.  Time: 20 minutes

## 2019-11-28 ENCOUNTER — Ambulatory Visit: Payer: Self-pay | Admitting: *Deleted

## 2019-11-28 ENCOUNTER — Ambulatory Visit
Admission: RE | Admit: 2019-11-28 | Discharge: 2019-11-28 | Disposition: A | Discharge status: Home / Self Care | Payer: No Typology Code available for payment source | Source: Ambulatory Visit | Attending: Obstetrics and Gynecology | Admitting: Obstetrics and Gynecology

## 2019-11-28 ENCOUNTER — Other Ambulatory Visit: Payer: Self-pay

## 2019-11-28 VITALS — BP 139/84 | Wt 173.9 lb

## 2019-11-28 DIAGNOSIS — R921 Mammographic calcification found on diagnostic imaging of breast: Secondary | ICD-10-CM

## 2019-11-28 DIAGNOSIS — Z1239 Encounter for other screening for malignant neoplasm of breast: Secondary | ICD-10-CM

## 2019-11-28 NOTE — Patient Instructions (Addendum)
Explained breast self awareness with Evalee Mutton. Patient did not need a Pap smear today due to her history of a hysterectomy for benign reasons. Let patient know that she doesn't need any further Pap smears due to her history of a hysterectomy for benign reasons.  Referred patient to the [facility] for a bilateral diagnostic mammogram per recommendation. Appointment scheduled Thursday, November 28, 2019 at 1140. Patient aware of appointment and will be there. Evalee Mutton verbalized understanding.  Kyara Boxer, Kathaleen Maser, RN 10:43 AM

## 2019-11-28 NOTE — Progress Notes (Signed)
[name] is a 54 y.o. female who presents to [facility] clinic today with no complaints. Patient's last left breast diagnostic mammogram completed 05/27/2019 showed left breast calcifications and bilateral diagnostic mammogram was recommended in 78-months.   Pap Smear: Pap smear not completed today. Last Pap smear was 11/09/2005 and normal. Per patient has no  history of an abnormal Pap smear. Patient has a history of a hysterectomy in 2007 for DUB. No further Pap smears are recommended due to her history of a hysterectomy for benign reasons per BCCCP and ASCCP guidelines. Last Pap smear result is in EPIC.   Physical exam: Breasts Breasts symmetrical. No skin abnormalities bilateral breasts. No nipple retraction bilateral breasts. No nipple discharge bilateral breasts. No lymphadenopathy. No lumps palpated bilateral breasts. No complaints of pain or tenderness on exam.      Pelvic/Bimanual Pap is not indicated today per BCCCP guidelines.   Smoking History: Patient has never smoked.   Patient Navigation: Patient education provided. Access to services provided for patient through [place] program. Spanish interpreter Heidi Compton from [facility] provided.   Colorectal Cancer Screening: Per patient had a colonoscopy completed in 1995.FIT Test completed 01/02/2017 that was negative.No complaints today.   Breast and Cervical Cancer Risk Assessment: Patient does not have family history of breast cancer, known genetic mutations, or radiation treatment to the chest before age 60. Patient does not have history of cervical dysplasia, immunocompromised, or DES exposure in-utero.  Risk Assessment    Risk Scores      11/28/2019 11/13/2018   Last edited by: Heidi Compton, CMA Heidi Compton, Heidi Grippe, RN   5-year risk: 0.6 % 0.6 %   Lifetime risk: 4.5 % 4.6 %          A: BCCCP exam without pap smear No complaints.  P: Referred patient to the [facility] for a bilateral  diagnostic mammogram per recommendation. Appointment scheduled Thursday, November 28, 2019 at 1140.  Heidi Heidelberg, RN 11/28/2019 10:43 AM

## 2019-12-03 ENCOUNTER — Ambulatory Visit (INDEPENDENT_AMBULATORY_CARE_PROVIDER_SITE_OTHER): Payer: Self-pay | Admitting: Orthopaedic Surgery

## 2019-12-03 ENCOUNTER — Other Ambulatory Visit: Payer: Self-pay

## 2019-12-03 DIAGNOSIS — S83241A Other tear of medial meniscus, current injury, right knee, initial encounter: Secondary | ICD-10-CM

## 2019-12-03 NOTE — Progress Notes (Signed)
Office Visit Note   Patient: Heidi Compton           Date of Birth: 1965-02-02           MRN: [id number] Visit Date: 12/03/2019              Requested by: Lenward Chancellor D, DO [address] [place],  [place] [address] PCP: Bridget Hartshorn, DO   Assessment & Plan: Visit Diagnoses:  1. Acute medial meniscal tear, right, initial encounter     Plan: Impression is symptomatic acute right medial meniscal tear without relief from cortisone injection.  I reviewed the MRI of the right knee which is from [facility] under the name of Heidi Compton.  Based on discussion risk benefits alternatives to knee arthroscopy and partial medial meniscectomy patient has elected to proceed with the recommended surgery.  All questions answered to her satisfaction.  Language interpreter utilized today.  Follow-Up Instructions: Return if symptoms worsen or fail to improve.   Orders:  No orders of the defined types were placed in this encounter.  No orders of the defined types were placed in this encounter.     Procedures: No procedures performed   Clinical Data: No additional findings.   Subjective: Chief Complaint  Patient presents with  . Right Knee - Follow-up    Patient returns today with language interpreter for follow-up of right knee pain.  Previous cortisone injection helped some as well as the brace has helped but she still has pain on the medial side of the knee related to the acute meniscal tear.  No changes in medical history otherwise.   Review of Systems   Objective: Vital Signs: There were no vitals taken for this visit.  Physical Exam  Ortho Exam Right knee exam is unchanged. Specialty Comments:  No specialty comments available.  Imaging: No results found.   PMFS History: Patient Active Problem List   Diagnosis Date Noted  . Acute medial meniscal tear, right, initial encounter 11/05/2019  . Elevated blood-pressure reading, without diagnosis of  hypertension 01/19/2019  . Screening breast examination 11/13/2018  . Hyperlipidemia 10/09/2017  . Prediabetes 10/09/2017  . Mastalgia 10/09/2017  . MDD (major depressive disorder), recurrent episode, moderate (HCC) 10/09/2017  . Right lateral epicondylitis 10/09/2017  . Healthcare maintenance 10/09/2017  . Breast lump on left side at 12 o'clock position 05/28/2013   Past Medical History:  Diagnosis Date  . Acid reflux   . Depression   . Urinary retention     Family History  Problem Relation Age of Onset  . Hypertension Father   . CAD Father 87  . Diabetes Maternal Aunt   . Diabetes Mother   . Heart disease Brother        CAD, h/o CABG; first MI at age 85  . Gout Brother   . Diabetes Maternal Aunt     Past Surgical History:  Procedure Laterality Date  . ABDOMINAL HYSTERECTOMY  11/09/2005  . BREAST BIOPSY Left 2017   benign  . CARPAL TUNNEL RELEASE Right    2000s  . CESAREAN SECTION     one previous   Social History   Occupational History  . Not on file  Tobacco Use  . Smoking status: Never Smoker  . Smokeless tobacco: Never Used  Vaping Use  . Vaping Use: Never used  Substance and Sexual Activity  . Alcohol use: Yes    Comment: occassionally  . Drug use: No  . Sexual activity: Yes  Partners: Male    Birth control/protection: Surgical

## 2019-12-12 ENCOUNTER — Ambulatory Visit: Payer: Self-pay

## 2019-12-14 ENCOUNTER — Other Ambulatory Visit (HOSPITAL_COMMUNITY): Payer: Self-pay

## 2019-12-18 ENCOUNTER — Inpatient Hospital Stay: Payer: Self-pay | Attending: Obstetrics and Gynecology | Admitting: *Deleted

## 2019-12-18 ENCOUNTER — Other Ambulatory Visit: Payer: Self-pay

## 2019-12-18 VITALS — BP 138/88 | Ht 65.0 in | Wt 174.5 lb

## 2019-12-18 DIAGNOSIS — Z0000 Encounter for general adult medical examination without abnormal findings: Secondary | ICD-10-CM

## 2019-12-18 NOTE — Progress Notes (Signed)
Wisewoman initial screening   Interpreter- [name], [place]   Clinical Measurement:  Vitals:   12/18/19 0932 12/18/19 0949  BP: 138/88 138/88   Fasting Labs Drawn Today, will review with patient when they result.   Medical History:  Patient states that she does not have high cholesterol, does not have high blood pressure and she does not have diabetes.  Medications:  Patient states that she does not take medication to lower cholesterol, blood pressure or blood sugar.  Patient does not take an aspirin a day to help prevent a heart attack or stroke.    Blood pressure, self measurement: Patient states that she does measure blood pressure from home. She checks her blood pressure monthly. She shares her readings with a health care provider: N/A.   Nutrition: Patient states that on average she eats 2 cups of fruit and 0 cups of vegetables per day. Patient states that she does not eat fish at least 2 times per week. Patient eats less than half servings of whole grains. Patient drinks less than 36 ounces of beverages with added sugar weekly: yes. Patient is currently watching sodium or salt intake: yes. In the past 7 days patient has had 0 drinks containing alcohol. On average patient drinks 6 drinks containing alcohol per day.      Physical activity:  Patient states that she gets 0 minutes of moderate and 0 minutes of vigorous physical activity each week.  Smoking status:  Patient states that she has has never smoked .   Quality of life:  Over the past 2 weeks patient states that she had little interest or pleasure in doing things: not at all. She has been feeling down, depressed or hopeless:not at all.    Risk reduction and counseling:   Health Coaching: Spoke with patient about watching the amount of fried and fatty foods consumed given past hx of elevated cholesterol levels. We discussed other ways to practice a heart healthy diet. Also spoke with patient about continuing watching the  amounts of sweets and sugars consumed as well as carbs given past hx of elevated glucose and hemoglobin A1C. Discussed adding more vegetables and whole grains in daily diet. Patient likes tilapia but not many other types of fish. Discussed with patient about other sources of omega-3's as well as supplements that could be taken. Patient has been unable to exercise due to a knee injury that is now going to require surgery in January.     Navigation:  I will notify patient of lab results.  Patient is aware of 2 more health coaching sessions and a follow up.  Time: 20 minutes

## 2019-12-19 LAB — LIPID PANEL
Chol/HDL Ratio: 5.8 ratio — ABNORMAL HIGH (ref 0.0–4.4)
Cholesterol, Total: 276 mg/dL — ABNORMAL HIGH (ref 100–199)
HDL: 48 mg/dL (ref >39)
LDL Chol Calc (NIH): 199 mg/dL — ABNORMAL HIGH (ref 0–99)
Triglycerides: 156 mg/dL — ABNORMAL HIGH (ref 0–149)
VLDL Cholesterol Cal: 29 mg/dL (ref 5–40)

## 2019-12-19 LAB — GLUCOSE, RANDOM: Glucose: 100 mg/dL — ABNORMAL HIGH (ref 65–99)

## 2019-12-19 LAB — HEMOGLOBIN A1C
Est. average glucose Bld gHb Est-mCnc: 137 mg/dL
Hgb A1c MFr Bld: 6.4 % — ABNORMAL HIGH (ref 4.8–5.6)

## 2019-12-23 ENCOUNTER — Telehealth: Payer: Self-pay

## 2019-12-23 NOTE — Telephone Encounter (Signed)
Left message for patient about lab results from Wise Woman. Left name and number for patient to call back.   

## 2019-12-23 NOTE — Telephone Encounter (Signed)
Health coaching 2   interpreter- [name], [employer]   Labs- 276 cholesterol, 199 LDL cholesterol, 156 triglycerides, 48 HDL cholesterol, 6.4 hemoglobin A1C, 100 mean plasma glucose. Patient understands and is aware of her lab results.   Goals- 1. Reduce the amount of fried and fatty foods consumed. Try to grill bake, broil or sautee foods instead. 2. Increase the amount of whole grains and heart healthy fish consumed. 3. Limit the amount of sweets and sugars consumed in both food and drink.  4. Watch the amount of carbs consumed. 5. Look for you tube videos of chair exercises that can be done until after knee surgery and during recovery.  Patient stated that she is ready to make lifestyle changes to get her numbers down.   Navigation:  Patient is aware of 1 more health coaching sessions and a follow up. Will call patient with follow-up appointment information with Internal Medicine once appointment is scheduled.   Time- 13 minutes

## 2019-12-24 ENCOUNTER — Telehealth: Payer: Self-pay | Admitting: Orthopaedic Surgery

## 2019-12-24 NOTE — Telephone Encounter (Signed)
Patient's medical request sent to [facility] 12/24/2019

## 2019-12-25 ENCOUNTER — Inpatient Hospital Stay: Payer: Self-pay | Admitting: Orthopaedic Surgery

## 2019-12-31 ENCOUNTER — Ambulatory Visit (INDEPENDENT_AMBULATORY_CARE_PROVIDER_SITE_OTHER): Payer: Self-pay | Admitting: Internal Medicine

## 2019-12-31 ENCOUNTER — Encounter: Payer: Self-pay | Admitting: Internal Medicine

## 2019-12-31 ENCOUNTER — Other Ambulatory Visit: Payer: Self-pay | Admitting: Internal Medicine

## 2019-12-31 ENCOUNTER — Other Ambulatory Visit: Payer: Self-pay

## 2019-12-31 DIAGNOSIS — Z0000 Encounter for general adult medical examination without abnormal findings: Secondary | ICD-10-CM

## 2019-12-31 DIAGNOSIS — R7303 Prediabetes: Secondary | ICD-10-CM

## 2019-12-31 DIAGNOSIS — R921 Mammographic calcification found on diagnostic imaging of breast: Secondary | ICD-10-CM

## 2019-12-31 DIAGNOSIS — E782 Mixed hyperlipidemia: Secondary | ICD-10-CM

## 2019-12-31 MED ORDER — ATORVASTATIN CALCIUM 20 MG PO TABS: 20.0000 mg | ORAL_TABLET | Freq: Every day | ORAL | 3 refills | Status: DC

## 2019-12-31 MED FILL — ATORVASTATIN CALCIUM 20 MG: 20 | 30 days supply | Qty: 30 | Fill #0

## 2019-12-31 NOTE — Assessment & Plan Note (Signed)
Breast Cancer Screening: Please order DIAGNOSTIC bilateral mammography with left breast magnification due December 2022

## 2019-12-31 NOTE — Progress Notes (Signed)
Office Visit   Patient ID: Heidi Compton, female    DOB: 01-May-1965, 54 y.o.   MRN: [id number]  Subjective:  CC: wise woman   HPI 54 y.o. presents today for follow up of her wise woman labs. See problem based charting for details, assessment and plan.      ACTIVE MEDICATIONS   Outpatient Medications Prior to Visit  Medication Sig Dispense Refill  . naproxen sodium (ANAPROX) 220 MG tablet Take 220 mg by mouth 2 (two) times daily with a meal.    . lansoprazole (PREVACID) 15 MG capsule Take 15 mg by mouth daily at 12 noon. (Patient not taking: Reported on 11/13/2019)     No facility-administered medications prior to visit.     ROS  Review of Systems  Constitutional: Negative for chills and fever.  Respiratory: Negative for cough and shortness of breath.   Musculoskeletal: Positive for arthralgias, gait problem and joint swelling.    Objective:   BP 136/86 (BP Location: Left Arm, Patient Position: Sitting, Cuff Size: Normal)   Pulse 78   Temp 98.2 F (36.8 C) (Oral)   Ht 5\' 6"  (1.676 m)   Wt 177 lb 8 oz (80.5 kg)   SpO2 99% Comment: room air  BMI 28.65 kg/m  Wt Readings from Last 3 Encounters:  12/31/19 177 lb 8 oz (80.5 kg)  12/18/19 174 lb 8 oz (79.2 kg)  11/28/19 173 lb 14.4 oz (78.9 kg)   BP Readings from Last 3 Encounters:  12/31/19 136/86  12/18/19 138/88  11/28/19 139/84   Physical Exam Constitutional:      Appearance: Normal appearance.  Cardiovascular:     Rate and Rhythm: Normal rate.  Pulmonary:     Effort: Pulmonary effort is normal.  Abdominal:     General: Abdomen is flat.     Palpations: Abdomen is soft.  Musculoskeletal:     Right lower leg: No edema.     Left lower leg: No edema.  Skin:    General: Skin is warm and dry.     Health Maintenance:   Health Maintenance  Topic Date Due  . Hepatitis C Screening  Never done  . HIV Screening  Never done  . PAP SMEAR-Modifier  Never done  . COLONOSCOPY  Never done  . COVID-19 Vaccine  (1) 01/16/2020 (Originally 09[birth year]01[birth year]1976)  . INFLUENZA VACCINE  04/09/2020 (Originally 08/11/2019)  . TETANUS/TDAP  12/30/2020 (Originally 09/02/1983)  . MAMMOGRAM  11/27/2021     Assessment & Plan:   Problem List Items Addressed This Visit      Other   Hyperlipidemia (Chronic)    This was an encounter for wise woman lab follow up. Lipid Panel     Component Value Date/Time   CHOL 276 (H) 12/18/2019 0959   TRIG 156 (H) 12/18/2019 0959   HDL 48 12/18/2019 0959   VLDL 22 09/29/2017 0942   LDLCALC 199 (H) 12/18/2019 0959   Assessment: 2018 ACC Guidelines on the Management of Blood Cholesterol recommend high intensity statin therapy for LDL >190  Plan: Due to being uninsured, she will have difficulty with following up to discuss adverse effects so would prefer to start with moderate intensity statin therapy with lipitor 20mg  daily. Goal is a reduction in LDL by >50%. If she is unable to achieve this with moderate intensity therapy, would consider escalation to high intensity statin dosing. Verbal and written education regarding lifestyle modifications provided at today's visit.      Relevant Medications   atorvastatin (LIPITOR)  20 MG tablet   Prediabetes (Chronic)    A1C is up to 6.5 this year.  I provided verbal and written education regarding lifestyle modifications to improve glycemic control.  Recommend repeat A1C in 6 months to 1 year to reassess need to start pharmacological therapy.      Healthcare maintenance (Chronic)    Breast Cancer Screening: Please order DIAGNOSTIC bilateral mammography with left breast magnification due December 2022      Calcification of left breast on mammography    Left breast calcifications are stable from prior however her high degree of breast density decreases sensitivity.  Plan -Bilateral diagnostic mammogram, with LEFT breast magnification views in 12 months as recommended on mammography read           Pt discussed with Dr.  Phyllis Ginger, MD Internal Medicine Resident PGY-2 [facility] Pager: [phone] 12/31/2019 6:46 PM

## 2019-12-31 NOTE — Patient Instructions (Signed)
Contenido de Hydrologist los alimentos Cholesterol Content in Foods El colesterol es una sustancia cerosa, parecida a la grasa, que contribuye a transportar la grasa en la [place]. El cuerpo Engineer, maintenance (IT) en pequeas cantidades, pero el exceso de colesterol puede causar dao en las arterias y Insurance underwriter. La mayora de las personas debera consumir menos de 200 miligramos (mg) de Teacher, early years/pre. Alimentos con colesterol  El colesterol se encuentra en los alimentos de origen animal, como la carne, los pescados y mariscos y los productos lcteos. En general, los productos lcteos descremados y las carnes [id number] [id number] tienen menos colesterol que los productos lcteos enteros y las carnes grasas. A continuacin, se enumeran los miligramos de colesterol por porcin (mg por porcin) de los alimentos comunes que contienen colesterol. Carne y otras protenas  Huevo: un huevo entero grande tiene .  Pata de ternera: 4 onzas (115g) tienen .  Carne picada magra de pavo (93% magra): 4 onzas (115g) tienen .  Lomo de cordero desgrasado: 4 onzas (115g) tienen .  Carne picada magra de res (90% magra): 4 onzas (115g) tienen .  [place]: 3,5 onzas (100g) tienen .  Chuletas de cerdo: 4 onzas (115g) tienen .  Salmn enlatado: 3,5 onzas (100g) tienen .  Lomo de res desgrasado: 4 onzas (115g) tienen .  Salchicha: 1 salchicha (3,5 onzas o 100g) tiene .  Cangrejo: 3,5 onzas (100g) tienen .  Pollo asado sin piel, carne blanca: 4 onzas (115g) tienen .  Salchichn light: 2 onzas (57g) tienen .  Hoy Finlay de pavita: 2 onzas (57g) tienen .  Atn enlatado: 3,5 onzas (100g) tienen .  Tocino: 1 onza (28g) tiene .  Ostras y mejillones (crudos): 3,5 onzas (100g) tienen .  Caballa: 1 onza (28g) tiene .  Trucha: 1 onza (28g) tiene .  Salchicha de cerdo: 1 salchicha (1 onza o 28g) tiene  .  Salmn: 1 onza (28g) tiene .  Tilapia: 1 onza (28g) tiene 14 mg. Lcteos  Helado cremoso:  taza (4 onzas o 115g) tiene .  Yogur entero: 1 taza (8 onzas o 227g) tiene .  Queso cheddar: 1onza (28g) tiene .  Lionel December americano: 1 onza (28g) tiene .  Leche entera: 1 taza (8 onzas o 227g) tiene .  Leche con contenido reducido de grasa (2%): 1 taza (8 onzas o 227g) tiene .  Queso crema: 1 cucharada tiene .  Requesn:  taza (4 onzas o 115g) tiene .  Leche con bajo contenido de grasa (1%): 1 taza (8 onzas o 227g) tiene .  Crema cida: 1 cucharada tiene 8,5mg .  Yogur con bajo contenido de grasa: 1 taza (8 onzas o 227g) tiene .  Yogur griego sin contenido de grasa: 1 taza (8 onzas o 227g) tiene .  Crema de [place]: 1 cucharada tiene . Grasas y aceites  Aceite de hgado de bacalao: 1 cucharada tiene .  Manteca: 1 cucharada tiene .  Steffanie Rainwater de cerdo: 1 cucharada tiene .  Grasa de tocino: 1 cucharada tiene .  Mayonesa: 1 cucharada tiene de 5 a .  Margarina: 1 cucharada tiene de 3 a . Las cantidades exactas de colesterol que contienen estos alimentos pueden variar dependiendo de los ingredientes especficos y las [place]. Alimentos sin colesterol La mayora de los alimentos a base de plantas no tiene colesterol, a menos que se combinen con un alimento que s tiene Oncologist. Algunos alimentos sin colesterol son los siguientes:  Granos y Medical laboratory scientific officer.  Verduras.  Nils Pyle.  Aceites vegetales, como el de  oliva, de canola y de [place].  Legumbres, [employer], los frijoles y las lentejas.  Frutos secos y semillas.  Claras de huevo. Resumen  El cuerpo necesita colesterol en pequeas cantidades, pero el exceso de colesterol puede causar dao en las arterias y Insurance underwriter.  La mayora de las personas debera consumir menos de 200 miligramos (mg) de Conservation officer, nature. Esta informacin no tiene Theme park manager el consejo del mdico. Asegrese de hacerle al mdico cualquier pregunta que tenga. Document Revised: 11/25/2016 Document Reviewed: 11/25/2016 Elsevier Patient Education  2020 Elsevier Inc. Colesterol elevado High Cholesterol  El colesterol elevado es una afeccin en la que la sangre tiene niveles altos de una sustancia [place], cerosa y parecida a la grasa (colesterol). El organismo humano necesita una pequea cantidad de colesterol. El hgado fabrica todo el colesterol que el organismo necesita. El exceso de colesterol proviene de los alimentos que comemos. La sangre transporta el colesterol desde el hgado a travs de los vasos sanguneos. Si tiene el colesterol elevado, este puede depositarse (formar placas) en las paredes de los vasos sanguneos (arterias). Las IT trainer y la rigidez de las arterias. Las placas de colesterol aumentan el riesgo de sufrir un infarto de miocardio y un accidente cerebrovascular. Trabaje con el mdico para [employer] concentraciones de colesterol en un rango saludable. Qu incrementa el riesgo? Es ms probable que Dietitian en las personas que:  Consumen alimentos con alto contenido de grasa animal (grasa saturada) o colesterol.  Tienen sobrepeso.  No hacen suficiente ejercicio fsico.  Tienen antecedentes familiares de colesterol elevado. Cules son los signos o los sntomas? Esta afeccin no presenta sntomas. Cmo se diagnostica? Esta afeccin podra diagnosticarse a [employer] de [place] de [place].  Si es mayor de 20aos, es posible que el mdico le controle el colesterol cada [id number].  Los controles pueden ser ms frecuentes si ya tuvo el colesterol elevado u otros factores de riesgo de enfermedades cardacas. En el anlisis de sangre de [place], se determina lo siguiente:  El colesterol "malo" (colesterol LDL). Este es el  principal tipo de colesterol que causa enfermedades cardacas. El nivel recomendado de LDL es de menos de100.  El colesterol "bueno" (colesterol HDL). Este tipo ayuda a Health visitor las enfermedades cardacas limpiando las arterias y arrastrando el LDL. El nivel recomendado de HDL es de60 o superior.  Triglicridos. Estos son grasas que el organismo puede Academic librarian o quemar como fuente de [place]. El nivel recomendado de triglicridos es de menos de 150.  Colesterol total. Esta es una medicin de la cantidad total de colesterol en la sangre, que incluye el colesterol LDL, el colesterol HDL y los triglicridos. El valor saludable es de menos de200. Cmo se trata? Esta afeccin se trata con cambios en la dieta y en el estilo de vida, y con medicamentos. Cambios en la [employer], la ingesta de una mayor cantidad de cereales integrales, frutas, verduras, frutos secos y pescado.  Tambin podran incluir la reduccin del consumo de carnes rojas y alimentos con Medical laboratory scientific officer. Cambios en el estilo de vida  Entre ellos, realizar sesiones de ejercicios aerbicos durante, por lo menos, , 3veces por semana. Por ejemplo, caminar, andar en bicicleta y nadar. Los ejercicios aerbicos junto con una dieta sana pueden ayudar a que se Dietitian en un peso saludable.  Los cambios tambin podran incluir dejar de fumar. Medicamentos  Por lo general, se administran medicamentos si con los  cambios en la alimentacin y en el estilo de vida no se logra reducir el colesterol hasta niveles saludables.  El mdico podra recetarle estatinas. Se ha demostrado que las estatinas [employer] niveles de [place], lo que puede reducir el riesgo de Marine scientist una enfermedad cardaca. Siga estas indicaciones en su casa: Comida y bebida Si se lo indic el mdico:  Coma pollo (sin piel), pescado, ternera, mariscos, pechuga de [place] y cortes de carne roja de pulpa o de lomo.  No coma  alimentos fritos ni carnes grasosas, como salchichas y salame.  Coma muchas frutas, como manzanas.  Coma gran cantidad de verduras, como brcoli, papas y zanahorias.  Coma porotos, guisantes secos y lentejas.  Coma cereales, como cebada, arroz, cuscs y trigo burgol.  Coma pastas sin salsas con crema.  Tome P[employer] Industries o semidescremada, y coma yogures y quesos descremados o semidescremados.  No coma ni beba Eastman [employer]ak, crema, helado, yemas de huevo ni quesos duros.  No coma margarinas en barra ni untables que contengan grasas trans (que tambin se conocen como aceites parcialmente hidrogenados).  No coma aceites tropicales saturados, como el de coco y el de [place].  No coma tortas, galletas, galletitas ni otros productos horneados que contengan grasas trans.  Instrucciones generales  Haga ejercicio segn las indicaciones del mdico. Aumente la cantidad de ejercicio fsico que realiza mediante actividades como jardinera, salir a Advertising account planner o usar las escaleras.  Tome los medicamentos de venta libre y los recetados solamente como se lo haya indicado el mdico.  No consuma ningn producto que contenga nicotina o tabaco, como cigarrillos y Administrator, Civil Service. Si necesita ayuda para dejar de fumar, consulte al mdico.  Concurra a todas las visitas de control como se lo haya indicado el mdico. Esto es importante. Comunquese con un mdico si:  Tiene dificultad para seguir una dieta sana o mantener un peso saludable.  Necesita ayuda para comenzar un programa de ejercicios.  Necesita ayuda para dejar de fumar. Solicite ayuda de inmediato si:  Midwife.  Tiene dificultad para respirar. Esta informacin no tiene Theme park manager el consejo del mdico. Asegrese de hacerle al mdico cualquier pregunta que tenga. Document Revised: 04/04/2016 Document Reviewed: 06/27/2015 Elsevier Patient Education  2020 [employer]. Plan de alimentacin para personas  con prediabetes Prediabetes Eating Plan La prediabetes es una afeccin que hace que los niveles de azcar en la sangre (glucosa) sean ms altos de lo normal. Esto aumenta el riesgo de tener diabetes. Para prevenir la diabetes, es posible que su mdico le recomiende cambios en la dieta y otros cambios en su estilo de vida que lo ayuden a Personnel officer lo siguiente:  Chief Operating Officer los niveles de glucemia.  Mejorar los niveles de [place].  Controlar la presin arterial. El mdico puede recomendarle que trabaje con un especialista en alimentacin y nutricin (nutricionista) para Materials engineer de comidas ms conveniente para usted. Consejos para seguir este plan: Estilo de vida  Establezca metas para bajar de peso con la ayuda de su equipo de atencin mdica. A la [employer] con prediabetes se les recomienda bajar un 7% de su peso corporal.  Haga ejercicio al menos 5das por semana, como mnimo.  Asista a un grupo de apoyo o solicite el apoyo continuo de un consejero de salud mental.  [employer] medicamentos de venta libre y los recetados solamente como se lo haya indicado el mdico. Leer las etiquetas de los alimentos  Lea las etiquetas de  los alimentos envasados para controlar la cantidad de grasa, sal (sodio) y azcar que contienen. Evite los alimentos que contengan lo siguiente: ? Grasas saturadas. ? Grasas trans. ? Azcares agregados.  Evite los alimentos que contengan ms de 331miligramos(mg) de sodio por porcin. Limite el consumo diario de sodio a menos de 2300mg  por . De compras  Evite comprar alimentos procesados y preelaborados. Coccin  Cocine con aceite de oliva. No use mantequilla, manteca de cerdo o Futures trader.  Cocine los alimentos al horno, a la parrilla, asados o hervidos. Evite frerlos. Planificacin de las comidas   Trabaje con el nutricionista para crear un plan de alimentacin que sea adecuado para usted. Esto puede  incluir lo siguiente: ? Registro de la cantidad de caloras que ingiere. Use un registro de alimentos, un cuaderno o una aplicacin mvil para anotar lo que comi en cada comida. ? Uso del ndice glucmico (IG) para planificar las comidas. El ndice [employer] Energy Corporation con qu rapidez elevar la glucemia un alimento. Elija alimentos con bajo IG. Estos demoran ms en elevar la glucemia.  Considere la posibilidad de seguir [place]. Esta dieta incluye lo siguiente: ? Varias porciones de frutas y verduras frescas por da. ? Pescado al Clinical cytogeneticist veces por semana. ? Varias porciones de cereales integrales, frijoles, frutos secos y semillas por da. ? Aceite de [employer] de otras grasas. ? Consumo moderado de alcohol. ? Pequeas cantidades de carnes rojas y lcteos enteros.  Si tiene hipertensin arterial, quizs Location manager consumo de sodio o seguir una dieta como el plan de alimentacin basado en Enfoques Alimentarios para Detener la Hipertensin (Dietary Approaches to Stop Hypertension, DASH). Este es un plan de alimentacin cuyo objetivo es bajar la hipertensin arterial. Qu alimentos se recomiendan? Es posible que los alimentos incluidos a continuacin no Secretary/administrator. Hable con el nutricionista sobre las mejores opciones alimenticias para usted. Cereales Productos integrales, como panes, galletas, cereales y pastas de salvado o integrales. Avena sin azcar. Trigo burgol. Cebada. Quinua. Arroz integral. Tacos o tortillas de harina de maz o de salvado. Engineer, drilling Hoover Brunette. Espinaca. Guisantes. Remolachas. Coliflor. Repollo. Brcoli. Zanahorias. Tomates. Calabaza. Deatra James. Hierbas. Pimienta. Cebollas. Pepinos. Repollitos de Bruselas. Frutas Frutos rojos. Bananas. Manzanas. Naranjas. Uvas. Papaya. Mango. [place]. Kiwi. Pomelo. Cerezas. Carnes y otros alimentos ricos en protenas Mariscos. Carne de ave sin piel. Cortes magros de cerdo y carne de res. Tofu. Huevos.  Frutos secos. Frijoles. Lcteos Productos lcteos descremados o semidescremados, como yogur, queso cottage y [place]. [place]. T. Caf. Gaseosas sin azcar o dietticas. Soda. Leche descremada o semidescremada. Productos alternativos a la [place], como [place] de soja o de [place]. Grasas y aceites Aceite de [place]. Aceite de canola. Aceite de girasol. Aceite de semillas de uva. Aguacate. Nueces. Dulces y postres Pudin sin azcar o con bajo contenido de [place]. Helado y otros postres congelados sin azcar o con bajo contenido de [place]. Condimentos y otros alimentos Hierbas. Especias sin sodio. Mostaza. Salsa de pepinillos. [place] con bajo contenido de Videm pri [place]tuju y de [place]the [place] deg [place] Salsa barbacoa con bajo contenido de grasa y de azcar. Mayonesa con bajo contenido de grasa o sin grasa. Qu alimentos no se recomiendan? Es posible que los alimentos incluidos a continuacin no International aid/development worker. Hable con el nutricionista sobre las mejores opciones alimenticias para usted. Cereales Productos elaborados con Engineer, drilling y [place], como panes, pastas, bocadillos y cereales. Verduras Verduras enlatadas. Verduras congeladas con mantequilla o salsa de crema. [place] Frutas enlatadas al almbar.  Carnes y otros alimentos ricos en protenas Cortes de carne con grasa. Carne de ave con piel. Carne empanizada o frita. Carnes procesadas. Lcteos Yogur, queso o Cardinal Healthleche enteros. Bebidas Bebidas azucaradas, como t helado dulce y Linngaseosas. Grasas y Barnes & Nobleaceites Mantequilla. Manteca de cerdo. Mantequilla clarificada. Dulces y Genuine Partspostres Productos horneados, como pasteles, pastelitos, galletas dulces y tarta de Darringtonqueso. Condimentos y otros alimentos Mezclas de especias con sal agregada. [place]. Salsa barbacoa. Mayonesa. Resumen  Para prevenir la diabetes, es posible que Personnel officerdeba hacer cambios en la dieta y otros cambios en su estilo de vida para ayudar a Pharmacologistcontrolar el azcar en la sangre, mejorar los niveles  de colesterol y Scientist, physiologicalcontrolar la presin arterial.  Establezca metas para bajar de peso con la ayuda de su equipo de atencin mdica. A la Franklin Resourcesmayora de las personas con prediabetes se les recomienda bajar un 7por ciento de su peso corporal.  Considere la posibilidad de seguir una dieta mediterrnea que incluya muchas frutas y verduras frescas, cereales integrales, frijoles, frutos secos, semillas, pescado, carnes magras, lcteos descremados y aceites saludables. Esta informacin no tiene Theme park managercomo fin reemplazar el consejo del mdico. Asegrese de hacerle al mdico cualquier pregunta que tenga. Document Revised: 07/11/2016 Document Reviewed: 07/11/2016 Elsevier Patient Education  2020 [employer]vier [employer].

## 2019-12-31 NOTE — Assessment & Plan Note (Addendum)
This was an encounter for [facility] lab follow up. Lipid Panel     Component Value Date/Time   CHOL 276 (H) 12/18/2019 0959   TRIG 156 (H) 12/18/2019 0959   HDL 48 12/18/2019 0959   VLDL 22 09/29/2017 0942   LDLCALC 199 (H) 12/18/2019 0959   Assessment: 2018 ACC Guidelines on the Management of Blood Cholesterol recommend high intensity statin therapy for LDL >190  Plan: Due to being uninsured, she will have difficulty with following up to discuss adverse effects so would prefer to start with moderate intensity statin therapy with lipitor 20mg  daily. Goal is a reduction in LDL by >50%. If she is unable to achieve this with moderate intensity therapy, would consider escalation to high intensity statin dosing. Verbal and written education regarding lifestyle modifications provided at today's visit.

## 2019-12-31 NOTE — Assessment & Plan Note (Addendum)
A1C is up to 6.5 this year.  I provided verbal and written education regarding lifestyle modifications to improve glycemic control.  Recommend repeat A1C in 6 months to 1 year to reassess need to start pharmacological therapy.

## 2019-12-31 NOTE — Assessment & Plan Note (Signed)
Left breast calcifications are stable from prior however her high degree of breast density decreases sensitivity.  Plan -Bilateral diagnostic mammogram, with LEFT breast magnification views in 12 months as recommended on mammography read

## 2020-01-15 NOTE — Progress Notes (Signed)
[facility] Medicine Clinic Attending  Case discussed with Dr. Christian  At the time of the visit.  We reviewed the resident's history and exam and pertinent patient test results.  I agree with the assessment, diagnosis, and plan of care documented in the resident's note.  

## 2020-01-22 ENCOUNTER — Encounter (HOSPITAL_BASED_OUTPATIENT_CLINIC_OR_DEPARTMENT_OTHER): Payer: Self-pay | Admitting: Orthopaedic Surgery

## 2020-01-22 ENCOUNTER — Telehealth: Payer: Self-pay | Admitting: Orthopaedic Surgery

## 2020-01-22 ENCOUNTER — Other Ambulatory Visit: Payer: Self-pay

## 2020-01-25 ENCOUNTER — Other Ambulatory Visit (HOSPITAL_COMMUNITY)
Admission: RE | Admit: 2020-01-25 | Discharge: 2020-01-25 | Disposition: A | Discharge status: Home / Self Care | Payer: Self-pay | Source: Ambulatory Visit | Attending: Orthopaedic Surgery | Admitting: Orthopaedic Surgery

## 2020-01-25 DIAGNOSIS — Z01818 Encounter for other preprocedural examination: Secondary | ICD-10-CM | POA: Insufficient documentation

## 2020-01-25 DIAGNOSIS — U071 COVID-19: Secondary | ICD-10-CM | POA: Insufficient documentation

## 2020-01-25 LAB — SARS CORONAVIRUS 2 (TAT 6-24 HRS): SARS Coronavirus 2: POSITIVE — AB

## 2020-01-25 NOTE — Progress Notes (Signed)
Covid positive.  Will need to reschedule. Thanks.

## 2020-02-03 ENCOUNTER — Telehealth: Payer: Self-pay | Admitting: Orthopaedic Surgery

## 2020-02-03 NOTE — Telephone Encounter (Signed)
Heidi Compton pts daughter called asking to have a work note emailed to her stating that due to covid the pts surgery was postponed from 01/29/2020 until 02/19/2020. Heidi Compton would also like a separate note stating the pts sister [name] Will be coming to take care of the pt after she has surgery.  [email or web address]

## 2020-02-03 NOTE — Telephone Encounter (Signed)
See message below. Is she going to be out of work until her SU? Or just OOW after SU?  Out of work for how long?

## 2020-02-03 NOTE — Telephone Encounter (Signed)
Out of work from now until at least 2 weeks after surgery

## 2020-02-04 NOTE — Telephone Encounter (Addendum)
Donata Clay called asking about the letters and states if it would be quicker she could pick them up; she states she needs them today or tomorrow the latest. She also wanted to make sure that in the letter for Heidi Compton it states that she is the pts sister.  [phone]

## 2020-02-05 NOTE — Telephone Encounter (Signed)
Completed and given to daughter that came into the office this morning

## 2020-02-07 MED FILL — ATORVASTATIN CALCIUM 20 MG: 20 | 30 days supply | Qty: 30 | Fill #1

## 2020-02-12 ENCOUNTER — Encounter (HOSPITAL_BASED_OUTPATIENT_CLINIC_OR_DEPARTMENT_OTHER): Payer: Self-pay | Admitting: Orthopaedic Surgery

## 2020-02-12 ENCOUNTER — Other Ambulatory Visit: Payer: Self-pay

## 2020-02-19 ENCOUNTER — Ambulatory Visit (HOSPITAL_BASED_OUTPATIENT_CLINIC_OR_DEPARTMENT_OTHER)
Admission: RE | Admit: 2020-02-19 | Discharge: 2020-02-19 | Disposition: A | Discharge status: Home / Self Care | Payer: Self-pay | Attending: Orthopaedic Surgery | Admitting: Orthopaedic Surgery

## 2020-02-19 ENCOUNTER — Other Ambulatory Visit (HOSPITAL_BASED_OUTPATIENT_CLINIC_OR_DEPARTMENT_OTHER): Payer: Self-pay | Admitting: Orthopaedic Surgery

## 2020-02-19 ENCOUNTER — Other Ambulatory Visit: Payer: Self-pay

## 2020-02-19 ENCOUNTER — Encounter (HOSPITAL_BASED_OUTPATIENT_CLINIC_OR_DEPARTMENT_OTHER): Payer: Self-pay | Admitting: Anesthesiology

## 2020-02-19 ENCOUNTER — Encounter (HOSPITAL_BASED_OUTPATIENT_CLINIC_OR_DEPARTMENT_OTHER)
Admission: RE | Disposition: A | Discharge status: Home / Self Care | Payer: Self-pay | Source: Home / Self Care | Attending: Orthopaedic Surgery

## 2020-02-19 ENCOUNTER — Telehealth: Payer: Self-pay | Admitting: Orthopaedic Surgery

## 2020-02-19 ENCOUNTER — Encounter: Payer: Self-pay | Admitting: Orthopaedic Surgery

## 2020-02-19 ENCOUNTER — Encounter (HOSPITAL_BASED_OUTPATIENT_CLINIC_OR_DEPARTMENT_OTHER): Payer: Self-pay | Admitting: Orthopaedic Surgery

## 2020-02-19 DIAGNOSIS — M659 Synovitis and tenosynovitis, unspecified: Secondary | ICD-10-CM | POA: Insufficient documentation

## 2020-02-19 DIAGNOSIS — S83241A Other tear of medial meniscus, current injury, right knee, initial encounter: Secondary | ICD-10-CM | POA: Insufficient documentation

## 2020-02-19 DIAGNOSIS — X58XXXA Exposure to other specified factors, initial encounter: Secondary | ICD-10-CM | POA: Insufficient documentation

## 2020-02-19 DIAGNOSIS — Z833 Family history of diabetes mellitus: Secondary | ICD-10-CM | POA: Insufficient documentation

## 2020-02-19 DIAGNOSIS — Z8249 Family history of ischemic heart disease and other diseases of the circulatory system: Secondary | ICD-10-CM | POA: Insufficient documentation

## 2020-02-19 DIAGNOSIS — Y939 Activity, unspecified: Secondary | ICD-10-CM | POA: Insufficient documentation

## 2020-02-19 DIAGNOSIS — Z79899 Other long term (current) drug therapy: Secondary | ICD-10-CM | POA: Insufficient documentation

## 2020-02-19 HISTORY — DX: Hyperlipidemia, unspecified: E78.5

## 2020-02-19 HISTORY — PX: KNEE ARTHROSCOPY WITH MENISCAL REPAIR: SHX5653

## 2020-02-19 SURGERY — ARTHROSCOPY, KNEE, WITH MENISCUS REPAIR
Anesthesia: General | Site: Knee | Laterality: Right

## 2020-02-19 MED ORDER — SODIUM CHLORIDE 0.9 % IR SOLN
Status: DC | PRN
  Administered 2020-02-19: 4500 mL

## 2020-02-19 MED ORDER — DEXAMETHASONE SODIUM PHOSPHATE 10 MG/ML IJ SOLN
INTRAMUSCULAR | Status: AC
  Filled 2020-02-19: qty 1

## 2020-02-19 MED ORDER — HYDROCODONE-ACETAMINOPHEN 5-325 MG PO TABS: 1.0000 | ORAL_TABLET | Freq: Four times a day (QID) | ORAL | 0 refills | Status: DC | PRN

## 2020-02-19 MED ORDER — LACTATED RINGERS IV SOLN: INTRAVENOUS | Status: DC

## 2020-02-19 MED ORDER — BUPIVACAINE HCL (PF) 0.25 % IJ SOLN
INTRAMUSCULAR | Status: DC | PRN
  Administered 2020-02-19: 20 mL via INTRA_ARTICULAR

## 2020-02-19 MED ORDER — FENTANYL CITRATE (PF) 100 MCG/2ML IJ SOLN
25.0000 ug | INTRAMUSCULAR | Status: DC | PRN
  Administered 2020-02-19 (×2): 50 ug via INTRAVENOUS

## 2020-02-19 MED ORDER — CEFAZOLIN SODIUM-DEXTROSE 2-4 GM/100ML-% IV SOLN
2.0000 g | INTRAVENOUS | Status: AC
  Administered 2020-02-19: 2 g via INTRAVENOUS

## 2020-02-19 MED ORDER — OXYCODONE HCL 5 MG PO TABS
5.0000 mg | ORAL_TABLET | Freq: Once | ORAL | Status: AC | PRN
  Administered 2020-02-19: 5 mg via ORAL

## 2020-02-19 MED ORDER — ACETAMINOPHEN 325 MG PO TABS: 325.0000 mg | ORAL_TABLET | ORAL | Status: DC | PRN

## 2020-02-19 MED ORDER — MEPERIDINE HCL 25 MG/ML IJ SOLN
6.2500 mg | INTRAMUSCULAR | Status: DC | PRN
Start: 2020-02-19 — End: 2020-02-19

## 2020-02-19 MED ORDER — OXYCODONE HCL 5 MG PO TABS
ORAL_TABLET | ORAL | Status: AC
  Filled 2020-02-19: qty 1

## 2020-02-19 MED ORDER — FENTANYL CITRATE (PF) 100 MCG/2ML IJ SOLN
INTRAMUSCULAR | Status: AC
  Filled 2020-02-19: qty 2

## 2020-02-19 MED ORDER — CEFAZOLIN SODIUM-DEXTROSE 2-4 GM/100ML-% IV SOLN
INTRAVENOUS | Status: AC
  Filled 2020-02-19: qty 100

## 2020-02-19 MED ORDER — PROPOFOL 10 MG/ML IV BOLUS
INTRAVENOUS | Status: DC | PRN
  Administered 2020-02-19: 160 mg via INTRAVENOUS

## 2020-02-19 MED ORDER — ONDANSETRON HCL 4 MG/2ML IJ SOLN
INTRAMUSCULAR | Status: AC
  Filled 2020-02-19: qty 2

## 2020-02-19 MED ORDER — KETOROLAC TROMETHAMINE 30 MG/ML IJ SOLN
INTRAMUSCULAR | Status: DC | PRN
  Administered 2020-02-19: 30 mg via INTRAVENOUS

## 2020-02-19 MED ORDER — FENTANYL CITRATE (PF) 100 MCG/2ML IJ SOLN
INTRAMUSCULAR | Status: DC | PRN
  Administered 2020-02-19 (×3): 50 ug via INTRAVENOUS

## 2020-02-19 MED ORDER — MIDAZOLAM HCL 5 MG/5ML IJ SOLN
INTRAMUSCULAR | Status: DC | PRN
  Administered 2020-02-19: 2 mg via INTRAVENOUS

## 2020-02-19 MED ORDER — MIDAZOLAM HCL 2 MG/2ML IJ SOLN
INTRAMUSCULAR | Status: AC
  Filled 2020-02-19: qty 2

## 2020-02-19 MED ORDER — ACETAMINOPHEN 160 MG/5ML PO SOLN: 325.0000 mg | ORAL | Status: DC | PRN

## 2020-02-19 MED ORDER — DEXAMETHASONE SODIUM PHOSPHATE 10 MG/ML IJ SOLN
INTRAMUSCULAR | Status: DC | PRN
  Administered 2020-02-19: 10 mg via INTRAVENOUS

## 2020-02-19 MED ORDER — LIDOCAINE HCL (PF) 1 % IJ SOLN
INTRAMUSCULAR | Status: AC
  Filled 2020-02-19: qty 60

## 2020-02-19 MED ORDER — PROPOFOL 10 MG/ML IV BOLUS
INTRAVENOUS | Status: AC
  Filled 2020-02-19: qty 20

## 2020-02-19 MED ORDER — OXYCODONE HCL 5 MG/5ML PO SOLN: 5.0000 mg | Freq: Once | ORAL | Status: AC | PRN

## 2020-02-19 MED ORDER — BACITRACIN ZINC 500 UNIT/GM EX OINT
TOPICAL_OINTMENT | CUTANEOUS | Status: AC
  Filled 2020-02-19: qty 56.7

## 2020-02-19 MED ORDER — ONDANSETRON HCL 4 MG PO TABS: 4.0000 mg | ORAL_TABLET | Freq: Three times a day (TID) | ORAL | 0 refills | Status: DC | PRN

## 2020-02-19 MED ORDER — ONDANSETRON HCL 4 MG/2ML IJ SOLN: 4.0000 mg | Freq: Once | INTRAMUSCULAR | Status: DC | PRN

## 2020-02-19 MED FILL — ONDANSETRON HCL 4 MG TABLET: 4 | 4 days supply | Qty: 20 | Fill #0

## 2020-02-19 MED FILL — HYDROCODON-APAP 5-325: 5-325 | 5 days supply | Qty: 20 | Fill #0

## 2020-02-19 SURGICAL SUPPLY — 42 items
ANCH SUT 2-0 5 STRG STRL LF (Miscellaneous) ×1 IMPLANT
ANCH SUT 2-0 5 STRL LF DISP (Miscellaneous) ×2 IMPLANT
BANDAGE ESMARK 6X9 LF (GAUZE/BANDAGES/DRESSINGS) IMPLANT
BLADE EXCALIBUR 4.0X13 (MISCELLANEOUS) IMPLANT
BLADE SHAVER TORPEDO 4X13 (MISCELLANEOUS) ×2 IMPLANT
BNDG CMPR 9X6 STRL LF SNTH (GAUZE/BANDAGES/DRESSINGS)
BNDG ELASTIC 6X5.8 VLCR STR LF (GAUZE/BANDAGES/DRESSINGS) ×2 IMPLANT
BNDG ESMARK 6X9 LF (GAUZE/BANDAGES/DRESSINGS)
COOLER ICEMAN CLASSIC (MISCELLANEOUS) ×2 IMPLANT
COVER WAND RF STERILE (DRAPES) IMPLANT
CUFF TOURN SGL QUICK 34 (TOURNIQUET CUFF) ×2
CUFF TRNQT CYL 34X4.125X (TOURNIQUET CUFF) ×1 IMPLANT
CUTTER SUT JUGGER STITCH CU (CUTTER) ×2 IMPLANT
DRAPE ARTHROSCOPY W/POUCH 90 (DRAPES) ×2 IMPLANT
DRAPE IMP U-DRAPE 54X76 (DRAPES) ×2 IMPLANT
DRAPE U-SHAPE 47X51 STRL (DRAPES) ×2 IMPLANT
DURAPREP 26ML APPLICATOR (WOUND CARE) ×4 IMPLANT
GAUZE SPONGE 4X4 12PLY STRL (GAUZE/BANDAGES/DRESSINGS) ×2 IMPLANT
GAUZE XEROFORM 1X8 LF (GAUZE/BANDAGES/DRESSINGS) ×2 IMPLANT
GLOVE SURG LTX SZ6.5 (GLOVE) ×2 IMPLANT
GLOVE SURG LTX SZ7 (GLOVE) ×2 IMPLANT
GLOVE SURG NEOP MICRO LF SZ7.5 (GLOVE) ×2 IMPLANT
GLOVE SURG SYN 7.5  E (GLOVE) ×1
GLOVE SURG SYN 7.5 E (GLOVE) ×1 IMPLANT
GLOVE SURG UNDER POLY LF SZ7 (GLOVE) ×4 IMPLANT
GOWN STRL REIN XL XLG (GOWN DISPOSABLE) ×4 IMPLANT
GOWN STRL REUS W/ TWL LRG LVL3 (GOWN DISPOSABLE) ×2 IMPLANT
GOWN STRL REUS W/ TWL XL LVL3 (GOWN DISPOSABLE) IMPLANT
GOWN STRL REUS W/TWL LRG LVL3 (GOWN DISPOSABLE) ×4
GOWN STRL REUS W/TWL XL LVL3 (GOWN DISPOSABLE)
IV NS IRRIG 3000ML ARTHROMATIC (IV SOLUTION) ×4 IMPLANT
JUGGERSTITCH IMPLANT CVD (Miscellaneous) ×4 IMPLANT
JUGGERSTITCH IMPLANT STRAIGHT (Miscellaneous) ×2 IMPLANT
JUGGERSTITCH SLED CANNULA (MISCELLANEOUS) ×2 IMPLANT
MANIFOLD NEPTUNE II (INSTRUMENTS) ×2 IMPLANT
PACK ARTHROSCOPY DSU (CUSTOM PROCEDURE TRAY) ×2 IMPLANT
PACK BASIN DAY SURGERY FS (CUSTOM PROCEDURE TRAY) ×2 IMPLANT
PAD COLD SHLDR WRAP-ON (PAD) ×2 IMPLANT
SHEET MEDIUM DRAPE 40X70 STRL (DRAPES) ×2 IMPLANT
SUT ETHILON 3 0 PS 1 (SUTURE) ×2 IMPLANT
TOWEL GREEN STERILE FF (TOWEL DISPOSABLE) ×2 IMPLANT
TUBING ARTHROSCOPY IRRIG 16FT (MISCELLANEOUS) ×2 IMPLANT

## 2020-02-19 NOTE — Telephone Encounter (Signed)
done

## 2020-02-19 NOTE — Anesthesia Procedure Notes (Signed)
Procedure Name: LMA Insertion Date/Time: 02/19/2020 12:13 PM Performed by: Burna Cash, CRNA Pre-anesthesia Checklist: Patient identified, Emergency Drugs available, Suction available and Patient being monitored Patient Re-evaluated:Patient Re-evaluated prior to induction Oxygen Delivery Method: Circle system utilized Preoxygenation: Pre-oxygenation with 100% oxygen Induction Type: IV induction Ventilation: Mask ventilation without difficulty LMA: LMA inserted LMA Size: 4.0 Number of attempts: 1 Airway Equipment and Method: Bite block Placement Confirmation: positive ETCO2 Tube secured with: Tape Dental Injury: Teeth and Oropharynx as per pre-operative assessment

## 2020-02-19 NOTE — Anesthesia Postprocedure Evaluation (Signed)
Anesthesia Post Note  Patient: Connor Foxworthy  Procedure(s) Performed: RIGHT KNEE ARTHROSCOPY WITH MEDIAL MENISCAL REPAIR (Right Knee)     Patient location during evaluation: PACU Anesthesia Type: General Level of consciousness: awake and alert Pain management: pain level controlled Vital Signs Assessment: post-procedure vital signs reviewed and stable Respiratory status: spontaneous breathing, nonlabored ventilation, respiratory function stable and patient connected to nasal cannula oxygen Cardiovascular status: blood pressure returned to baseline and stable Postop Assessment: no apparent nausea or vomiting Anesthetic complications: no   No complications documented.  Last Vitals:  Vitals:   02/19/20 1415 02/19/20 1432  BP: 121/75 140/84  Pulse: 65 71  Resp: 10 18  Temp:  36.8 C  SpO2: 96% 100%    Last Pain:  Vitals:   02/19/20 1432  TempSrc:   PainSc: 4                  Amerah Puleo

## 2020-02-19 NOTE — Op Note (Addendum)
Surgery Date: 02/19/2020  PREOPERATIVE DIAGNOSES:  1. Right knee medial meniscus tear 2. Right knee mild synovitis  POSTOPERATIVE DIAGNOSES:  same  PROCEDURES PERFORMED:  1. Right knee arthroscopy with major synovectomy 2. Right knee arthroscopy with arthroscopic medial meniscus repair 3. Right knee arthroscopy with arthroscopic chondroplasty medial femoral condyle and patella.   SURGEON: N. Glee Arvin, M.D.  ASSIST: Starlyn Skeans [place], [place]; necessary for the timely completion of procedure and due to complexity of procedure.  ANESTHESIA:  general  FLUIDS: Per anesthesia record.   ESTIMATED BLOOD LOSS: minimal  DESCRIPTION OF PROCEDURE: Heidi Compton is a 55 y.o.-year-old female with above mentioned conditions. Full discussion held regarding risks benefits alternatives and complications related surgical intervention. Conservative care options reviewed. All questions answered.  The patient was identified in the preoperative holding area and the operative extremity was marked. The patient was brought to the operating room and transferred to operating table in a supine position. Satisfactory general anesthesia was induced by anesthesiology.    Standard anterolateral, anteromedial arthroscopy portals were obtained. The anteromedial portal was obtained with a spinal needle for localization under direct visualization with subsequent diagnostic findings.   Incisions were made for arthroscopic portals.  Diagnostic knee arthroscopy was first performed which revealed mild synovitis.  Major synovectomy was performed in the medial lateral gutters as well as the superior patellofemoral pouch as well as medial and lateral compartments.  after this was done gentle valgus force was placed on the knee to address the medial meniscal tear.  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  A horizontal cleavage tear of the posterior horn was identified.  We first began with a meniscus  basket by trimming the leading edge of the tear back to a border that was symmetric with the inferior flap.  The meniscal root was intact.  The mid body showed some degenerative fraying.  Only a small amount of the volume of the meniscus had to be resected in order to perform the repair.  The vast majority of the volume of the meniscus was retained.  After the meniscus was prepared we then performed a repair of the horizontal tear by using 3 vertical mattresses using an all inside technique with the [employer] device.  Each suture was tightened down and there was good closure of the cleavage tear.  The remaining meniscus was smoothed out with a full-radius shaver.  The cruciates and the lateral compartment were unremarkable.  There was grade II chondromalacia of the apex of the patella for which gentle chondroplasty was performed.  Gutters were checked for loose bodies.  Excess fluid was removed from the knee joint.  Incisions were closed with interrupted nylon sutures.  Sterile dressings were applied.  Patient tolerated procedure well had no immediate complications.  Suprapatellar pouch and gutters: mild synovitis or debris. Patella chondral surface: Grade 2 Trochlear chondral surface: Grade 1 Patellofemoral tracking: normal Medial meniscus: horizontal cleavage tear.  Medial femoral condyle weight bearing surface: Grade 1 Medial tibial plateau: Grade 0 Anterior cruciate ligament:stable Posterior cruciate ligament:stable Lateral meniscus: normal.   Lateral femoral condyle weight bearing surface: Grade 0 Lateral tibial plateau: Grade 0  Heidi Compton was critical for opening, closing, exposing, limb positioning and overall facilitation and timely completion of the surgery.  DISPOSITION: The patient was awakened from general anesthetic, extubated, taken to the recovery room in medically stable condition, no apparent complications. The patient may be weightbearing as tolerated to the operative  lower extremity.  Range of motion of right knee as tolerated.  Heidi Reel, MD [facility] 12:57 PM

## 2020-02-19 NOTE — H&P (Signed)
PREOPERATIVE H&P  Chief Complaint: right knee medial meniscal tear  HPI: Heidi Compton is a 55 y.o. female who presents for surgical treatment of right knee medial meniscal tear.  She denies any changes in medical history.  Past Medical History:  Diagnosis Date  . Acid reflux   . Depression   . Hyperlipemia   . Urinary retention    Past Surgical History:  Procedure Laterality Date  . ABDOMINAL HYSTERECTOMY  11/09/2005  . BREAST BIOPSY Left 2017   benign  . CARPAL TUNNEL RELEASE Right    2000s  . CESAREAN SECTION     one previous   Social History   Socioeconomic History  . Marital status: Single    Spouse name: Not on file  . Number of children: Not on file  . Years of education: Not on file  . Highest education level: 12th grade  Occupational History  . Not on file  Tobacco Use  . Smoking status: Never Smoker  . Smokeless tobacco: Never Used  Vaping Use  . Vaping Use: Never used  Substance and Sexual Activity  . Alcohol use: Yes    Comment: occassionally  . Drug use: No  . Sexual activity: Yes    Partners: Male    Birth control/protection: Surgical  Other Topics Concern  . Not on file  Social History Narrative  . Not on file   Social Determinants of Health   Financial Resource Strain: Not on file  Food Insecurity: Not on file  Transportation Needs: No Transportation Needs  . Lack of Transportation (Medical): No  . Lack of Transportation (Non-Medical): No  Physical Activity: Not on file  Stress: Not on file  Social Connections: Not on file   Family History  Problem Relation Age of Onset  . Hypertension Father   . CAD Father 62  . Diabetes Maternal Aunt   . Diabetes Mother   . Heart disease Brother        CAD, h/o CABG; first MI at age 25  . Gout Brother   . Diabetes Maternal Aunt    No Known Allergies Prior to Admission medications   Medication Sig Start Date End Date Taking? Authorizing Provider  atorvastatin (LIPITOR) 20 MG tablet  Take 1 tablet (20 mg total) by mouth daily. 12/31/19 04/29/20 Yes Christian, Rylee, MD  gabapentin (NEURONTIN) 300 MG capsule Take 300 mg by mouth 3 (three) times daily.   Yes [provider]  naproxen sodium (ANAPROX) 220 MG tablet Take 220 mg by mouth 2 (two) times daily with a meal.   Yes [provider]     Positive ROS: All other systems have been reviewed and were otherwise negative with the exception of those mentioned in the HPI and as above.  Physical Exam: General: Alert, no acute distress Cardiovascular: No pedal edema Respiratory: No cyanosis, no use of accessory musculature GI: abdomen soft Skin: No lesions in the area of chief complaint Neurologic: Sensation intact distally Psychiatric: Patient is competent for consent with normal mood and affect Lymphatic: no lymphedema  MUSCULOSKELETAL: exam stable  Assessment: right knee medial meniscal tear  Plan: Plan for Procedure(s): RIGHT KNEE ARTHROSCOPY WITH PARTIAL MEDIAL MENISCECTOMY  The risks benefits and alternatives were discussed with the patient including but not limited to the risks of nonoperative treatment, versus surgical intervention including infection, bleeding, nerve injury,  blood clots, cardiopulmonary complications, morbidity, mortality, among others, and they were willing to proceed.   Preoperative templating of the joint replacement has  been completed, documented, and submitted to the Operating Room personnel in order to optimize intra-operative equipment management.   Glee Arvin, MD 02/19/2020 11:18 AM

## 2020-02-19 NOTE — Anesthesia Preprocedure Evaluation (Signed)
Anesthesia Evaluation  Patient identified by MRN, date of birth, ID band Patient awake    Reviewed: Allergy & Precautions, H&P , NPO status , Patient's Chart, lab work & pertinent test results, reviewed documented beta blocker date and time   Airway Mallampati: II  TM Distance: >3 FB Neck ROM: full    Dental no notable dental hx. (+) Teeth Intact, Dental Advisory Given   Pulmonary neg pulmonary ROS,    Pulmonary exam normal breath sounds clear to auscultation       Cardiovascular Exercise Tolerance: Good negative cardio ROS   Rhythm:regular Rate:Normal     Neuro/Psych PSYCHIATRIC DISORDERS Depression negative neurological ROS     GI/Hepatic Neg liver ROS, GERD  Medicated,  Endo/Other  negative endocrine ROS  Renal/GU negative Renal ROS  negative genitourinary   Musculoskeletal  (+) Arthritis , Osteoarthritis,    Abdominal   Peds  Hematology negative hematology ROS (+)   Anesthesia Other Findings   Reproductive/Obstetrics negative OB ROS                             Anesthesia Physical Anesthesia Plan  ASA: II  Anesthesia Plan: General   Post-op Pain Management:    Induction:   PONV Risk Score and Plan: 3 and Ondansetron and Dexamethasone  Airway Management Planned: LMA  Additional Equipment:   Intra-op Plan:   Post-operative Plan:   Informed Consent: I have reviewed the patients History and Physical, chart, labs and discussed the procedure including the risks, benefits and alternatives for the proposed anesthesia with the patient or authorized representative who has indicated his/her understanding and acceptance.     Dental Advisory Given  Plan Discussed with: CRNA and Anesthesiologist  Anesthesia Plan Comments:         Anesthesia Quick Evaluation

## 2020-02-19 NOTE — Discharge Instructions (Signed)
*You had 5 mg of oxycodone at 2:40pm  Post Anesthesia Home Care Instructions  Activity: Get plenty of rest for the remainder of the day. A responsible individual must stay with you for 24 hours following the procedure.  For the next 24 hours, DO NOT: -Drive a car -Advertising copywriter -Drink alcoholic beverages -Take any medication unless instructed by your physician -Make any legal decisions or sign important papers.  Meals: Start with liquid foods such as gelatin or soup. Progress to regular foods as tolerated. Avoid greasy, spicy, heavy foods. If nausea and/or vomiting occur, drink only clear liquids until the nausea and/or vomiting subsides. Call your physician if vomiting continues.  Special Instructions/Symptoms: Your throat may feel dry or sore from the anesthesia or the breathing tube placed in your throat during surgery. If this causes discomfort, gargle with warm salt water. The discomfort should disappear within 24 hours.  If you had a scopolamine patch placed behind your ear for the management of post- operative nausea and/or vomiting:  1. The medication in the patch is effective for 72 hours, after which it should be removed.  Wrap patch in a tissue and discard in the trash. Wash hands thoroughly with soap and water. 2. You may remove the patch earlier than 72 hours if you experience unpleasant side effects which may include dry mouth, dizziness or visual disturbances. 3. Avoid touching the patch. Wash your hands with soap and water after contact with the patch.          Post-operative patient instructions  Knee Arthroscopy   . Ice:  Place intermittent ice or cooler pack over your knee, 30 minutes on and 30 minutes off.  Continue this for the first 72 hours after surgery, then save ice for use after therapy sessions or on more active days.   . Weight:  You may bear weight on your leg as your symptoms allow. . Crutches:  Use crutches (or walker) to assist in walking until  told to discontinue by your physical therapist or physician. This will help to reduce pain. . Strengthening:  Perform simple thigh squeezes (isometric quad contractions) and straight leg lifts as you are able (3 sets of 5 to 10 repetitions, 3 times a day).  For the leg lifts, have someone support under your ankle in the beginning until you have increased strength enough to do this on your own.  To help get started on thigh squeezes, place a pillow under your knee and push down on the pillow with back of knee (sometimes easier to do than with your leg fully straight). . Motion:  Perform gentle knee motion as tolerated - this is gentle bending and straightening of the knee. Seated heel slides: you can start by sitting in a chair, remove your brace, and gently slide your heel back on the floor - allowing your knee to bend. Have someone help you straighten your knee (or use your other leg/foot hooked under your ankle.  . Dressing:  Perform 1st dressing change at 2 days postoperative. A moderate amount of blood tinged drainage is to be expected.  So if you bleed through the dressing on the first or second day or if you have fevers, it is fine to change the dressing/check the wounds early and redress wound. Elevate your leg.  If it bleeds through again, or if the incisions are leaking frank blood, please call the office. May change dressing every 1-2 days thereafter to help watch wounds. Can purchase Tegaderm (or 3M Nexcare)  water resistant dressings at local pharmacy / [facility]. . Shower:  Light shower is ok after 2 days.  Please take shower, NO bath. Recover with gauze and ace wrap to help keep wounds protected.   . Pain medication:  A narcotic pain medication has been prescribed.  Take as directed.  Typically you need narcotic pain medication more regularly during the first 3 to 5 days after surgery.  Decrease your use of the medication as the pain improves.  Narcotics can sometimes cause constipation, even after a  few doses.  If you have problems with constipation, you can take an over the counter stool softener or light laxative.  If you have persistent problems, please notify your physician's office. Marland Kitchen Physical therapy: Additional activity guidelines to be provided by your physician or physical therapist at follow-up visits.  . Driving: Do not recommend driving x 2 weeks post surgical, especially if surgery performed on right side. Should not drive while taking narcotic pain medications. It typically takes at least 2 weeks to restore sufficient neuromuscular function for normal reaction times for driving safety.  . Call [phone] for questions or problems. Evenings you will be forwarded to the hospital operator.  Ask for the orthopaedic physician on call. Please call if you experience:    o Redness, foul smelling, or persistent drainage from the surgical site  o worsening knee pain and swelling not responsive to medication  o any calf pain and or swelling of the lower leg  o temperatures greater than 101.5 F o other questions or concerns   Thank you for allowing [place] to be a part of your care.

## 2020-02-19 NOTE — Telephone Encounter (Signed)
Patient's daughter Heidi Compton called requesting a call back from Dr. Roda Shutters. She states her mother had surgery and did not get a chance to speak with Dr. Roda Shutters about instructions or surgery. Please call at [phone].

## 2020-02-19 NOTE — Transfer of Care (Signed)
Immediate Anesthesia Transfer of Care Note  Patient: Heidi Compton  Procedure(s) Performed: RIGHT KNEE ARTHROSCOPY WITH MEDIAL MENISCAL REPAIR (Right Knee)  Patient Location: PACU  Anesthesia Type:General  Level of Consciousness: sedated  Airway & Oxygen Therapy: Patient Spontanous Breathing and Patient connected to face mask oxygen  Post-op Assessment: Report given to RN and Post -op Vital signs reviewed and stable  Post vital signs: Reviewed and stable  Last Vitals:  Vitals Value Taken Time  BP 98/64 02/19/20 1309  Temp    Pulse 71 02/19/20 1310  Resp 11 02/19/20 1310  SpO2 99 % 02/19/20 1310  Vitals shown include unvalidated device data.  Last Pain:  Vitals:   02/19/20 1044  TempSrc: Oral  PainSc: 7       Patients Stated Pain Goal: 5 (02/19/20 1044)  Complications: No complications documented.

## 2020-02-20 ENCOUNTER — Encounter (HOSPITAL_BASED_OUTPATIENT_CLINIC_OR_DEPARTMENT_OTHER): Payer: Self-pay | Admitting: Orthopaedic Surgery

## 2020-02-21 ENCOUNTER — Telehealth: Payer: Self-pay

## 2020-02-21 NOTE — Telephone Encounter (Signed)
APPT MADE

## 2020-02-27 ENCOUNTER — Ambulatory Visit (INDEPENDENT_AMBULATORY_CARE_PROVIDER_SITE_OTHER): Payer: Self-pay | Admitting: Physician Assistant

## 2020-02-27 ENCOUNTER — Other Ambulatory Visit: Payer: Self-pay | Admitting: Physician Assistant

## 2020-02-27 DIAGNOSIS — Z9889 Other specified postprocedural states: Secondary | ICD-10-CM

## 2020-02-27 MED ORDER — HYDROCODONE-ACETAMINOPHEN 5-325 MG PO TABS: 1.0000 | ORAL_TABLET | Freq: Every day | ORAL | 0 refills | Status: DC | PRN

## 2020-02-27 MED FILL — HYDROCODON-APAP 5-325: 5-325 | 10 days supply | Qty: 20 | Fill #0

## 2020-02-27 NOTE — Progress Notes (Signed)
   Post-Op Visit Note   Patient: Heidi Compton           Date of Birth: 07-Feb-1965           MRN: [id number] Visit Date: 02/27/2020 PCP: Bridget Hartshorn, DO   Assessment & Plan:  Chief Complaint:  Chief Complaint  Patient presents with  . Right Knee - Routine Post Op   Visit Diagnoses:  1. S/P right knee arthroscopy     Plan: Patient is a pleasant 55 year old Spanish-speaking female who is here today with an interpreter.  She is about a week and a half out right knee arthroscopic debridement and medial meniscus repair.  She has been doing well.  No fevers or chills.  She is taking narcotic pain medication at night to help sleep.  Overall, doing well.  Examination right knee reveals well-healing surgical portals with nylon sutures in place.  No evidence of infection or cellulitis.  Calves are soft and minimally tender.  She is neurovascular intact distally.  Today, sutures were removed and Steri-Strips applied.  The patient may continue to advance with activity as tolerated but will avoid any running.  Home exercise program provided.  She will follow up with [place] in 5 weeks time for recheck.  Out of work note provided for the next 5 weeks.  Call with concerns or questions.  Follow-Up Instructions: Return in about 5 weeks (around 04/02/2020).   Orders:  No orders of the defined types were placed in this encounter.  No orders of the defined types were placed in this encounter.   Imaging: No new imaging  PMFS History: Patient Active Problem List   Diagnosis Date Noted  . Calcification of left breast on mammography 12/31/2019  . Acute medial meniscal tear, right, initial encounter 11/05/2019  . Elevated blood-pressure reading, without diagnosis of hypertension 01/19/2019  . Hyperlipidemia 10/09/2017  . Prediabetes 10/09/2017  . Right lateral epicondylitis 10/09/2017  . Healthcare maintenance 10/09/2017   Past Medical History:  Diagnosis Date  . Acid reflux   . Depression    . Hyperlipemia   . Urinary retention     Family History  Problem Relation Age of Onset  . Hypertension Father   . CAD Father 36  . Diabetes Maternal Aunt   . Diabetes Mother   . Heart disease Brother        CAD, h/o CABG; first MI at age 90  . Gout Brother   . Diabetes Maternal Aunt     Past Surgical History:  Procedure Laterality Date  . ABDOMINAL HYSTERECTOMY  11/09/2005  . BREAST BIOPSY Left 2017   benign  . CARPAL TUNNEL RELEASE Right    2000s  . CESAREAN SECTION     one previous  . KNEE ARTHROSCOPY WITH MENISCAL REPAIR Right 02/19/2020   Procedure: RIGHT KNEE ARTHROSCOPY WITH MEDIAL MENISCAL REPAIR;  Surgeon: Tarry Kos, MD;  Location: [facility];  Service: Orthopedics;  Laterality: Right;   Social History   Occupational History  . Not on file  Tobacco Use  . Smoking status: Never Smoker  . Smokeless tobacco: Never Used  Vaping Use  . Vaping Use: Never used  Substance and Sexual Activity  . Alcohol use: Yes    Comment: occassionally  . Drug use: No  . Sexual activity: Yes    Partners: Male    Birth control/protection: Surgical

## 2020-03-11 MED FILL — ATORVASTATIN CALCIUM 20 MG: 20 | 30 days supply | Qty: 30 | Fill #2

## 2020-04-02 ENCOUNTER — Encounter: Payer: Self-pay | Admitting: Orthopaedic Surgery

## 2020-04-02 ENCOUNTER — Ambulatory Visit (INDEPENDENT_AMBULATORY_CARE_PROVIDER_SITE_OTHER): Payer: Self-pay | Admitting: Physician Assistant

## 2020-04-02 DIAGNOSIS — Z9889 Other specified postprocedural states: Secondary | ICD-10-CM

## 2020-04-02 MED ORDER — TRAMADOL HCL 50 MG PO TABS: 50.0000 mg | ORAL_TABLET | Freq: Three times a day (TID) | ORAL | 0 refills | Status: DC | PRN

## 2020-04-02 NOTE — Progress Notes (Signed)
Post-Op Visit Note   Patient: Heidi Compton           Date of Birth: 1965-07-09           MRN: [id number] Visit Date: 04/02/2020 PCP: Bridget Hartshorn, DO   Assessment & Plan:  Chief Complaint:  Chief Complaint  Patient presents with  . Right Knee - Pain   Visit Diagnoses:  1. S/P right knee arthroscopy     Plan: Patient is a very pleasant 55 year old Spanish-speaking female who is here today with interpreter.  She is 6 weeks out right knee arthroscopic debridement medial meniscus repair.  She has been doing well.  She has been [place] this on her own home.  She is making great progress with range of motion.  She still has some pain which is not quite relieved with naproxen but the Norco makes her sick.  Examination of her right knee reveals fully healed surgical scars without complication.  She does have mild effusion.  Range of motion 0 to 120 degrees.  She is neurovascular intact distally.  At this point, she will continue to advance with activity as tolerated.  She will ice and elevate for pain and swelling.  I have called in tramadol to take as needed.  Follow-up with [place] in 6 weeks for recheck.  Call with concerns or questions.  Follow-Up Instructions: Return in about 6 weeks (around 05/14/2020).   Orders:  No orders of the defined types were placed in this encounter.  Meds ordered this encounter  Medications  . traMADol (ULTRAM) 50 MG tablet    Sig: Take 1 tablet (50 mg total) by mouth 3 (three) times daily as needed.    Dispense:  30 tablet    Refill:  0    Imaging: No new imaging  PMFS History: Patient Active Problem List   Diagnosis Date Noted  . Calcification of left breast on mammography 12/31/2019  . Acute medial meniscal tear, right, initial encounter 11/05/2019  . Elevated blood-pressure reading, without diagnosis of hypertension 01/19/2019  . Hyperlipidemia 10/09/2017  . Prediabetes 10/09/2017  . Right lateral epicondylitis 10/09/2017  .  Healthcare maintenance 10/09/2017   Past Medical History:  Diagnosis Date  . Acid reflux   . Depression   . Hyperlipemia   . Urinary retention     Family History  Problem Relation Age of Onset  . Hypertension Father   . CAD Father 63  . Diabetes Maternal Aunt   . Diabetes Mother   . Heart disease Brother        CAD, h/o CABG; first MI at age 22  . Gout Brother   . Diabetes Maternal Aunt     Past Surgical History:  Procedure Laterality Date  . ABDOMINAL HYSTERECTOMY  11/09/2005  . BREAST BIOPSY Left 2017   benign  . CARPAL TUNNEL RELEASE Right    2000s  . CESAREAN SECTION     one previous  . KNEE ARTHROSCOPY WITH MENISCAL REPAIR Right 02/19/2020   Procedure: RIGHT KNEE ARTHROSCOPY WITH MEDIAL MENISCAL REPAIR;  Surgeon: Tarry Kos, MD;  Location: [facility];  Service: Orthopedics;  Laterality: Right;   Social History   Occupational History  . Not on file  Tobacco Use  . Smoking status: Never Smoker  . Smokeless tobacco: Never Used  Vaping Use  . Vaping Use: Never used  Substance and Sexual Activity  . Alcohol use: Yes    Comment: occassionally  . Drug use: No  .  Sexual activity: Yes    Partners: Male    Birth control/protection: Surgical

## 2020-04-03 ENCOUNTER — Telehealth: Payer: Self-pay

## 2020-04-03 ENCOUNTER — Other Ambulatory Visit: Payer: Self-pay | Admitting: Physician Assistant

## 2020-04-03 MED ORDER — TRAMADOL HCL 50 MG PO TABS
50.0000 mg | ORAL_TABLET | Freq: Three times a day (TID) | ORAL | 0 refills | Status: DC | PRN
Start: 2020-04-03 — End: 2020-04-03

## 2020-04-03 MED FILL — traMADol HCL 50 MG TABS: 50 | 10 days supply | Qty: 30 | Fill #0

## 2020-04-03 NOTE — Telephone Encounter (Signed)
Can you resend to correct pharm. Thanks

## 2020-04-03 NOTE — Telephone Encounter (Signed)
Sent in

## 2020-04-03 NOTE — Telephone Encounter (Signed)
Patient came into the office she stated the rx for tramadol was sent to the wrong pharmacy she is requesting [facility] she would like a call back when rx has been sent call back:[phone]

## 2020-04-20 ENCOUNTER — Other Ambulatory Visit (HOSPITAL_COMMUNITY): Payer: Self-pay

## 2020-04-20 MED FILL — Atorvastatin Calcium Tab 20 MG (Base Equivalent): ORAL | 30 days supply | Qty: 30 | Fill #0 | Status: AC

## 2020-04-29 ENCOUNTER — Other Ambulatory Visit: Payer: Self-pay

## 2020-05-14 ENCOUNTER — Ambulatory Visit (INDEPENDENT_AMBULATORY_CARE_PROVIDER_SITE_OTHER): Payer: Self-pay | Admitting: Orthopaedic Surgery

## 2020-05-14 DIAGNOSIS — Z9889 Other specified postprocedural states: Secondary | ICD-10-CM

## 2020-05-14 DIAGNOSIS — S83241A Other tear of medial meniscus, current injury, right knee, initial encounter: Secondary | ICD-10-CM

## 2020-05-14 NOTE — Progress Notes (Signed)
   Post-Op Visit Note   Patient: Heidi Compton           Date of Birth: 10-04-1965           MRN: [id number] Visit Date: 05/14/2020 PCP: Bridget Hartshorn, DO   Assessment & Plan:  Chief Complaint: No chief complaint on file.  Visit Diagnoses:  1. S/P right knee arthroscopy   2. Acute medial meniscal tear, right, initial encounter     Plan:   Fausto Skillern is 3 months status post right knee scope with medial meniscal root repair.  She is overall doing okay and has resumed daily activities.  Takes 3 naproxen's a day and has some pain at times and she is also doing home exercises.  Overall she is pleased with the recovery and outcome.  Right knee shows fully healed surgical scars.  No joint effusion.  Painless near full range of motion.  Collaterals and cruciates are stable.  No jointline tenderness.  At this point Fausto Skillern has done well and recovered from the surgery.  Feels fine to release her to activity as tolerated.  She knows to do what she can based on the limits of her discomfort.  Happy with how everything is turned out and I feel that she will continue to improve with time.  We will see her back as needed.  Follow-Up Instructions: Return if symptoms worsen or fail to improve.   Orders:  No orders of the defined types were placed in this encounter.  No orders of the defined types were placed in this encounter.   Imaging: No results found.  PMFS History: Patient Active Problem List   Diagnosis Date Noted  . Calcification of left breast on mammography 12/31/2019  . Acute medial meniscal tear, right, initial encounter 11/05/2019  . Elevated blood-pressure reading, without diagnosis of hypertension 01/19/2019  . Hyperlipidemia 10/09/2017  . Prediabetes 10/09/2017  . Right lateral epicondylitis 10/09/2017  . Healthcare maintenance 10/09/2017   Past Medical History:  Diagnosis Date  . Acid reflux   . Depression   . Hyperlipemia   . Urinary retention     Family  History  Problem Relation Age of Onset  . Hypertension Father   . CAD Father 71  . Diabetes Maternal Aunt   . Diabetes Mother   . Heart disease Brother        CAD, h/o CABG; first MI at age 16  . Gout Brother   . Diabetes Maternal Aunt     Past Surgical History:  Procedure Laterality Date  . ABDOMINAL HYSTERECTOMY  11/09/2005  . BREAST BIOPSY Left 2017   benign  . CARPAL TUNNEL RELEASE Right    2000s  . CESAREAN SECTION     one previous  . KNEE ARTHROSCOPY WITH MENISCAL REPAIR Right 02/19/2020   Procedure: RIGHT KNEE ARTHROSCOPY WITH MEDIAL MENISCAL REPAIR;  Surgeon: Tarry Kos, MD;  Location: [facility];  Service: Orthopedics;  Laterality: Right;   Social History   Occupational History  . Not on file  Tobacco Use  . Smoking status: Never Smoker  . Smokeless tobacco: Never Used  Vaping Use  . Vaping Use: Never used  Substance and Sexual Activity  . Alcohol use: Yes    Comment: occassionally  . Drug use: No  . Sexual activity: Yes    Partners: Male    Birth control/protection: Surgical

## 2020-06-19 ENCOUNTER — Encounter: Payer: Self-pay | Admitting: *Deleted

## 2020-07-14 ENCOUNTER — Encounter: Payer: Self-pay | Admitting: *Deleted

## 2020-10-21 ENCOUNTER — Other Ambulatory Visit: Payer: Self-pay

## 2020-10-21 DIAGNOSIS — R921 Mammographic calcification found on diagnostic imaging of breast: Secondary | ICD-10-CM

## 2020-10-26 ENCOUNTER — Telehealth: Payer: Self-pay

## 2020-10-26 NOTE — Telephone Encounter (Signed)
Health Coaching 3  interpreter- [name], [facility]   Goals- Patient has been trying to eat more fruits and vegetables recently. Patient states that she has been drinking more water. Patient has been trying to exercise more but has been having issues with her knees and has not been able to do as much exercise as she would like. Patient is using a fitness watch to track steps and to hold patient accountable to get daily steps in.   New goal- Continue with trying to get daily exercise in for 20-30 minutes  Barrier to reaching goal- Pain in knees.   Strategies to overcome- Low-intensity workouts such as chair exercises.    Navigation:  Patient is aware of  a follow up session. Patient is scheduled for follow-up in November 23, 2020 @ 11:00 am.   Time-  10 minutes

## 2020-11-10 ENCOUNTER — Other Ambulatory Visit: Payer: Self-pay

## 2020-11-10 ENCOUNTER — Encounter (HOSPITAL_BASED_OUTPATIENT_CLINIC_OR_DEPARTMENT_OTHER): Payer: Self-pay | Admitting: *Deleted

## 2020-11-10 ENCOUNTER — Emergency Department (HOSPITAL_BASED_OUTPATIENT_CLINIC_OR_DEPARTMENT_OTHER): Payer: Self-pay

## 2020-11-10 DIAGNOSIS — Z23 Encounter for immunization: Secondary | ICD-10-CM | POA: Insufficient documentation

## 2020-11-10 DIAGNOSIS — Y93G9 Activity, other involving cooking and grilling: Secondary | ICD-10-CM | POA: Insufficient documentation

## 2020-11-10 DIAGNOSIS — X102XXA Contact with fats and cooking oils, initial encounter: Secondary | ICD-10-CM | POA: Insufficient documentation

## 2020-11-10 DIAGNOSIS — T22211A Burn of second degree of right forearm, initial encounter: Secondary | ICD-10-CM | POA: Insufficient documentation

## 2020-11-10 DIAGNOSIS — X501XXA Overexertion from prolonged static or awkward postures, initial encounter: Secondary | ICD-10-CM | POA: Insufficient documentation

## 2020-11-10 DIAGNOSIS — M7731 Calcaneal spur, right foot: Secondary | ICD-10-CM | POA: Insufficient documentation

## 2020-11-10 DIAGNOSIS — S93601A Unspecified sprain of right foot, initial encounter: Secondary | ICD-10-CM | POA: Insufficient documentation

## 2020-11-10 NOTE — ED Triage Notes (Signed)
She twisted her right foot yesterday. Swelling and pain. Burn noted to her right forearm. She burned her arm while cooking with hot olive oil.

## 2020-11-11 ENCOUNTER — Emergency Department (HOSPITAL_BASED_OUTPATIENT_CLINIC_OR_DEPARTMENT_OTHER)
Admission: EM | Admit: 2020-11-11 | Discharge: 2020-11-11 | Disposition: A | Discharge status: Home / Self Care | Payer: Self-pay | Attending: Emergency Medicine | Admitting: Emergency Medicine

## 2020-11-11 DIAGNOSIS — S93601A Unspecified sprain of right foot, initial encounter: Secondary | ICD-10-CM

## 2020-11-11 DIAGNOSIS — T22211A Burn of second degree of right forearm, initial encounter: Secondary | ICD-10-CM

## 2020-11-11 MED ORDER — TETANUS-DIPHTH-ACELL PERTUSSIS 5-2.5-18.5 LF-MCG/0.5 IM SUSY: 0.5000 mL | PREFILLED_SYRINGE | Freq: Once | INTRAMUSCULAR | Status: AC

## 2020-11-11 MED ORDER — NAPROXEN 375 MG PO TABS: ORAL_TABLET | ORAL | 0 refills | Status: DC

## 2020-11-11 MED ORDER — TETANUS-DIPHTH-ACELL PERTUSSIS 5-2.5-18.5 LF-MCG/0.5 IM SUSY
PREFILLED_SYRINGE | INTRAMUSCULAR | Status: AC
  Administered 2020-11-11: 0.5 mL via INTRAMUSCULAR
  Filled 2020-11-11: qty 0.5

## 2020-11-11 MED ORDER — NAPROXEN 250 MG PO TABS
500.0000 mg | ORAL_TABLET | Freq: Once | ORAL | Status: AC
  Administered 2020-11-11: 500 mg via ORAL
  Filled 2020-11-11: qty 2

## 2020-11-11 NOTE — ED Provider Notes (Signed)
[facility] Provider Note: Lowella Dell, MD, FACEP  CSN: [id number] MRN: [id number] ARRIVAL: 11/10/20 at 2106 ROOM: MH12/MH12   CHIEF COMPLAINT  Foot Injury   HISTORY OF PRESENT ILLNESS  11/11/20 12:15 AM Heidi Compton is a 55 y.o. female twisted her right foot the evening before yesterday.  She has not having pain in her mid foot.  She rates it as a 10 out of 10, worse with weightbearing.  She has been taking naproxen without adequate relief.  She also has a burn to her right forearm from cooking with hot olive oil 4 days ago.   Past Medical History:  Diagnosis Date   Acid reflux    Depression    Hyperlipemia    Urinary retention     Past Surgical History:  Procedure Laterality Date   ABDOMINAL HYSTERECTOMY  11/09/2005   BREAST BIOPSY Left 2017   benign   CARPAL TUNNEL RELEASE Right    2000s   CESAREAN SECTION     one previous   KNEE ARTHROSCOPY WITH MENISCAL REPAIR Right 02/19/2020   Procedure: RIGHT KNEE ARTHROSCOPY WITH MEDIAL MENISCAL REPAIR;  Surgeon: Tarry Kos, MD;  Location: [facility];  Service: Orthopedics;  Laterality: Right;    Family History  Problem Relation Age of Onset   Hypertension Father    CAD Father 47   Diabetes Maternal Aunt    Diabetes Mother    Heart disease Brother        CAD, h/o CABG; first MI at age 28   Gout Brother    Diabetes Maternal Aunt     Social History   Tobacco Use   Smoking status: Never   Smokeless tobacco: Never  Vaping Use   Vaping Use: Never used  Substance Use Topics   Alcohol use: Yes    Comment: occassionally   Drug use: No    Prior to Admission medications   Medication Sig Start Date End Date Taking? Authorizing Provider  atorvastatin (LIPITOR) 20 MG tablet TAKE 1 TABLET (20 MG TOTAL) BY MOUTH DAILY. 12/31/19 12/30/20  Elige Radon, MD  gabapentin (NEURONTIN) 300 MG capsule Take 300 mg by mouth 3 (three) times daily.    [provider]  naproxen (NAPROSYN)  375 MG tablet Take 1 tablet twice daily as needed for foot pain. 11/11/20  Yes Betzabeth Derringer, MD  ondansetron (ZOFRAN) 4 MG tablet TAKE 1-2 TABLETS (4-8 MG TOTAL) BY MOUTH EVERY 8 (EIGHT) HOURS AS NEEDED FOR NAUSEA OR VOMITING. 02/19/20 02/18/21  Tarry Kos, MD    Allergies Patient has no known allergies.   REVIEW OF SYSTEMS  Negative except as noted here or in the History of Present Illness.   PHYSICAL EXAMINATION  Initial Vital Signs Blood pressure (!) 138/93, pulse 69, temperature 98.3 F (36.8 C), temperature source Oral, resp. rate 18, height 5\' 6"  (1.676 m), weight 78.9 kg, SpO2 97 %.  Examination General: Well-developed, well-nourished female in no acute distress; appearance consistent with age of record HENT: normocephalic; atraumatic Eyes: Normal appearance Neck: supple Heart: regular rate and rhythm Lungs: clear to auscultation bilaterally Abdomen: soft; nondistended; nontender; bowel sounds present Extremities: Tenderness of right midfoot without deformity, foot neurovascularly intact Neurologic: Awake, alert; motor function intact in all extremities and symmetric; no facial droop Skin: Warm and dry; superficial burn right dorsal forearm:    Psychiatric: Normal mood and affect   RESULTS  Summary of this visit's results, reviewed and interpreted by myself:   EKG Interpretation  Date/Time:    Ventricular Rate:    PR Interval:    QRS Duration:   QT Interval:    QTC Calculation:   R Axis:     Text Interpretation:         Laboratory Studies: No results found for this or any previous visit (from the past 24 hour(s)). Imaging Studies: DG Foot Complete Right  Result Date: 11/10/2020 CLINICAL DATA:  Right foot injury, pain, swelling EXAM: RIGHT FOOT COMPLETE - 3+ VIEW COMPARISON:  None. FINDINGS: Plantar calcaneal spur. No acute bony abnormality. Specifically, no fracture, subluxation, or dislocation. IMPRESSION: No acute bony abnormality. Electronically Signed    By: Charlett Nose M.D.   On: 11/10/2020 21:33    ED COURSE and MDM  Nursing notes, initial and subsequent vitals signs, including pulse oximetry, reviewed and interpreted by myself.  Vitals:   11/10/20 2117 11/10/20 2118 11/11/20 0018  BP:  (!) 138/93 136/89  Pulse:  69 (!) 58  Resp:  18 16  Temp:  98.3 F (36.8 C)   TempSrc:  Oral   SpO2:  97% 98%  Weight: 78.9 kg    Height: 5\' 6"  (1.676 m)     Medications  Tdap (BOOSTRIX) injection 0.5 mL (has no administration in time range)    Will place patient in postop shoe.  She already has crutches.  She was advised to continue naproxen as needed for pain.  PROCEDURES  Procedures   ED DIAGNOSES     ICD-10-CM   1. Sprain of right foot, initial encounter  S93.601A     2. Partial thickness burn of right forearm, initial encounter  T22.211A          Maggie Dworkin, , MD 11/11/20 [id number]

## 2020-11-23 ENCOUNTER — Other Ambulatory Visit: Payer: Self-pay

## 2020-11-23 ENCOUNTER — Inpatient Hospital Stay: Payer: Self-pay | Attending: Obstetrics and Gynecology | Admitting: *Deleted

## 2020-11-23 VITALS — BP 138/98 | Ht 65.0 in | Wt 174.2 lb

## 2020-11-23 DIAGNOSIS — Z0000 Encounter for general adult medical examination without abnormal findings: Secondary | ICD-10-CM

## 2020-11-23 NOTE — Progress Notes (Signed)
[facility] follow up   Interpreter: [name], Haroldine Laws  Clinical Measurement:   Vitals:   11/23/20 1054  BP: (!) 148/98      Medical History:  Patient states that she has high cholesterol, has high blood pressure and she has diabetes.  Medications:  Patient states that she does not take medication to lower cholesterol, blood pressure and blood sugar.  Patient does not take an aspirin a day to help prevent a heart attack or stroke. During the past 7 days patient N/A taken    Blood pressure, self measurement:  :  Patient states that she does measure blood pressure from home. She checks her blood pressure a few times per week. She shares her readings with a health care provider: no.   Nutrition: Patient states that on average she eats 1 cups of fruit and 1 cups of vegetables per day. Patient states that she does not eat fish at least 2 times per week. Patient eats less than half servings of whole grains. Patient drinks less than 36 ounces of beverages with added sugar weekly: yes. Patient is currently watching sodium or salt intake: yes. In the past 7 days patient has had 1 drinks containing alcohol. On average patient drinks 1 drinks containing alcohol per day.      Physical activity:  Patient states that she gets 210 minutes of moderate and 0 minutes of vigorous physical activity each week.  Smoking status:  Patient states that she has has never smoked .   Quality of life:  Over the past 2 weeks patient states that she had little interest or pleasure in doing things: not at all. She has been feeling down, depressed or hopeless:not at all.    Risk reduction and counseling:   Health Coaching: Spoke with patient about continuing trying to add more fruits and vegetables in daily diet. Patient does not care for fish. Spoke with patient about other foods that can be consumed to get Omega-3's. Patient does not drink sodas often. Encouraged patient to continue watching the amount of sweets and  sugars consumed. Encouraged patient to continue watching the amount of salt that she consumes due to elevated blood pressure. Patient has been walking 7 days a week for 30 minutes. Encouraged patient to continue with exercise routine.    Navigation: This was the  follow up session for this patient, I will check up on her progress in the coming months. Offered to refer patient to Internal Medicine for elevated BP. Patient would like to wait until re-screening visit to be referred.   Time: 20 minutes

## 2020-12-08 ENCOUNTER — Ambulatory Visit: Payer: Self-pay | Admitting: *Deleted

## 2020-12-08 ENCOUNTER — Other Ambulatory Visit: Payer: Self-pay

## 2020-12-08 ENCOUNTER — Ambulatory Visit
Admission: RE | Admit: 2020-12-08 | Discharge: 2020-12-08 | Disposition: A | Discharge status: Home / Self Care | Payer: No Typology Code available for payment source | Source: Ambulatory Visit | Attending: Obstetrics and Gynecology | Admitting: Obstetrics and Gynecology

## 2020-12-08 VITALS — BP 126/74 | Wt 174.7 lb

## 2020-12-08 DIAGNOSIS — R921 Mammographic calcification found on diagnostic imaging of breast: Secondary | ICD-10-CM

## 2020-12-08 DIAGNOSIS — Z1211 Encounter for screening for malignant neoplasm of colon: Secondary | ICD-10-CM

## 2020-12-08 DIAGNOSIS — Z1239 Encounter for other screening for malignant neoplasm of breast: Secondary | ICD-10-CM

## 2020-12-08 NOTE — Patient Instructions (Signed)
Explained breast self awareness with Evalee Mutton. Patient did not need a Pap smear today due to patient has a history of a hysterectomy for benign reasons. Let patient know that she doesn't need any further Pap smears due to her history of hysterectomy for benign reasons. Referred patient to the [facility] for a bilateral diagnostic mammogram per recommendation. Appointment scheduled Tuesday, December 08, 2020 at 1450. Patient aware of appointment and will be there. Evalee Mutton verbalized understanding.  Zacarias Krauter, Kathaleen Maser, RN 1:47 PM

## 2020-12-08 NOTE — Progress Notes (Signed)
[name] is a 55 y.o. female who presents to [facility] clinic today with no complaints. Patient's last left breast diagnostic mammogram at the [facility] Center completed 11/28/2019 showed stable left breast calcifications and bilateral diagnostic mammogram recommended in one year for follow-up.   Pap Smear: Pap smear not completed today. Last Pap smear was 11/09/2005 and normal. Per patient has no  history of an abnormal Pap smear. Patient has a history of a hysterectomy in 2007 for DUB. No further Pap smears are recommended due to her history of a hysterectomy for benign reasons per BCCCP and ASCCP guidelines. Last Pap smear result is available in EPIC.   Physical exam: Breasts Breasts symmetrical. No skin abnormalities bilateral breasts. No nipple retraction bilateral breasts. No nipple discharge bilateral breasts. No lymphadenopathy. No lumps palpated bilateral breasts. No complaints of pain or tenderness on exam.      Pelvic/Bimanual Pap is not indicated today per BCCCP guidelines.   Smoking History: Patient has never smoked.   Patient Navigation: Patient education provided. Access to services provided for patient through [place] program. Spanish interpreter [name] from [facility] provided.   Colorectal Cancer Screening: Per patient had a colonoscopy completed in 2001 that was benign. FIT Test given to patient to complete. No complaints today.    Breast and Cervical Cancer Risk Assessment: Patient does not have family history of breast cancer, known genetic mutations, or radiation treatment to the chest before age 29. Patient does not have history of cervical dysplasia, immunocompromised, or DES exposure in-utero.  Risk Assessment     Risk Scores       12/08/2020 11/28/2019   Last edited by: Narda Rutherford, LPN Meryl Dare, CMA   5-year risk: 0.6 % 0.6 %   Lifetime risk: 4.4 % 4.5 %            A: BCCCP exam without pap smear No complaints.  P: Referred  patient to the [facility] for a bilateral diagnostic mammogram per recommendation. Appointment scheduled Tuesday, December 08, 2020 at 1450.  Heidi Heidelberg, RN 12/08/2020 1:47 PM

## 2021-01-08 LAB — FECAL OCCULT BLOOD, IMMUNOCHEMICAL: Fecal Occult Bld: NEGATIVE

## 2021-01-18 ENCOUNTER — Telehealth: Payer: Self-pay

## 2021-01-18 NOTE — Telephone Encounter (Signed)
Via, Delorise Royals, Spanish Interpreter [facility]), Patient informed negative FIT test results. Patient verbalized understanding.

## 2021-02-08 ENCOUNTER — Inpatient Hospital Stay: Payer: No Typology Code available for payment source | Attending: Obstetrics and Gynecology | Admitting: *Deleted

## 2021-02-08 ENCOUNTER — Other Ambulatory Visit: Payer: Self-pay

## 2021-02-08 VITALS — BP 128/82 | Ht 65.0 in | Wt 175.3 lb

## 2021-02-08 DIAGNOSIS — Z0000 Encounter for general adult medical examination without abnormal findings: Secondary | ICD-10-CM

## 2021-02-08 NOTE — Progress Notes (Signed)
[facility] initial screening   Interpreter- [name], Haroldine Laws   Clinical Measurement:  Vitals:   02/08/21 0947  BP: 130/80   Fasting Labs Drawn Today, will review with patient when they result.   Medical History:  Patient states that she has high cholesterol, has high blood pressure and she has diabetes.  Medications:  Patient states that she does not take medication to lower cholesterol, blood pressure or blood sugar.  Patient does not take an aspirin a day to help prevent a heart attack or stroke.    Blood pressure, self measurement: Patient states that she does measure blood pressure from home. She checks her blood pressure weekly. She shares her readings with a health care provider: no.   Nutrition: Patient states that on average she eats 2 cups of fruit and 1 cups of vegetables per day. Patient states that she does not eat fish at least 2 times per week. Patient eats about half servings of whole grains. Patient drinks less than 36 ounces of beverages with added sugar weekly: yes. Patient is currently watching sodium or salt intake: yes. In the past 7 days patient has consumed drinks containing alcohol on 0 days. On a day that patient consumes drinks containing alcohol on average 0 drinks are consumed.      Physical activity:  Patient states that she gets 105 minutes of moderate and 0 minutes of vigorous physical activity each week.  Smoking status:  Patient states that she has has never smoked .   Quality of life:  Over the past 2 weeks patient states that she had little interest or pleasure in doing things: several days. She has been feeling down, depressed or hopeless:several days.    Risk reduction and counseling:   Health Coaching: Spoke with patient about making changes with diet. Patient states that she would like to loose weight. We talked about how maintaining a heart healthy diet will help her in her reaching her goal of weight loss. Spoke with patient about the daily  recommendations for the different food groups. Also spoke with patient about portion control.  Goal: Patient would like to loose weight over the next several months. Patient would like to achieve this through making changes with diet and exercise.    Navigation:  I will notify patient of lab results.  Patient is aware of 2 more health coaching sessions and a follow up.  Time: 25 minutes

## 2021-02-09 LAB — LIPID PANEL
Chol/HDL Ratio: 5.6 ratio — ABNORMAL HIGH (ref 0.0–4.4)
Cholesterol, Total: 236 mg/dL — ABNORMAL HIGH (ref 100–199)
HDL: 42 mg/dL (ref >39)
LDL Chol Calc (NIH): 163 mg/dL — ABNORMAL HIGH (ref 0–99)
Triglycerides: 168 mg/dL — ABNORMAL HIGH (ref 0–149)
VLDL Cholesterol Cal: 31 mg/dL (ref 5–40)

## 2021-02-09 LAB — HEMOGLOBIN A1C
Est. average glucose Bld gHb Est-mCnc: 131 mg/dL
Hgb A1c MFr Bld: 6.2 % — ABNORMAL HIGH (ref 4.8–5.6)

## 2021-02-09 LAB — GLUCOSE, RANDOM: Glucose: 97 mg/dL (ref 70–99)

## 2021-02-15 ENCOUNTER — Telehealth: Payer: Self-pay

## 2021-02-15 NOTE — Telephone Encounter (Signed)
Left message for patient about lab results from [facility] via Erika McReynolds, [employer]. Left name and number for patient to call back.   

## 2021-02-16 ENCOUNTER — Telehealth: Payer: Self-pay

## 2021-02-16 NOTE — Telephone Encounter (Signed)
Health coaching 2   interpreter- Rudene Anda, [place]- 236 cholesterol, 163 LDL cholesterol, 168 triglycerides, 42 HDL cholesterol, 6.2 hemoglobin A1C, 97 mean plasma glucose.  Patient understands and is aware of her lab results.   Goals-  Spoke with patient about continuing to try and maintain a heart healthy diet. Spoke with patient about watching the amount of fried and fatty foods consumed. Reducing the amount of red meats consumed. Increasing daily fruits and vegetable intake as well as whole grains. We also spoke about watching the amount of sweets and sugars consumed as well as cutting back on carbs. Encouraged patient to continue to work towards getting 20-30 minutes of exercise daily. By making these changes patient will be able to achieve her goal of weight loss.    Navigation:  Patient is aware of 1 more health coaching sessions and a follow up. Patient is scheduled for follow-up with Internal Medicine on Friday, Feb. 17, 2023 @ 9:15 AM for elevated labs.   Time-  10 minutes

## 2021-02-26 ENCOUNTER — Ambulatory Visit (INDEPENDENT_AMBULATORY_CARE_PROVIDER_SITE_OTHER): Payer: Self-pay | Admitting: Internal Medicine

## 2021-02-26 ENCOUNTER — Other Ambulatory Visit (HOSPITAL_COMMUNITY)
Admission: RE | Admit: 2021-02-26 | Discharge: 2021-02-26 | Disposition: A | Discharge status: Home / Self Care | Payer: No Typology Code available for payment source | Source: Ambulatory Visit | Attending: Student in an Organized Health Care Education/Training Program | Admitting: Student in an Organized Health Care Education/Training Program

## 2021-02-26 VITALS — BP 123/73 | HR 77 | Temp 98.4°F | Wt 178.3 lb

## 2021-02-26 DIAGNOSIS — Z0000 Encounter for general adult medical examination without abnormal findings: Secondary | ICD-10-CM

## 2021-02-26 DIAGNOSIS — R7303 Prediabetes: Secondary | ICD-10-CM

## 2021-02-26 DIAGNOSIS — E782 Mixed hyperlipidemia: Secondary | ICD-10-CM

## 2021-02-26 DIAGNOSIS — S83241A Other tear of medial meniscus, current injury, right knee, initial encounter: Secondary | ICD-10-CM

## 2021-02-26 NOTE — Progress Notes (Signed)
° °  CC: prediabetes, hyperlipidemia   HPI:  [name] is a 56 y.o. female with PMHx as stated below presenting for follow up of her hyperlipidemia and prediabetes. She denies any acute concerns at this time. Please see problem based charting for complete assessment and plan.   Past Medical History:  Diagnosis Date   Acid reflux    Depression    Hyperlipemia    Urinary retention    Review of Systems:  Negative except as stated in HPI.  Physical Exam:  Vitals:   02/26/21 0928  BP: 123/73  Pulse: 77  Temp: 98.4 F (36.9 C)  TempSrc: Oral  SpO2: 99%  Weight: 178 lb 4.8 oz (80.9 kg)   Physical Exam  Constitutional: Middle aged female, appears stated age. No distress.  Cardiovascular: Normal rate, regular rhythm, S1 and S2 present, no murmurs, rubs, gallops.  Distal pulses intact Respiratory: Lungs are clear to auscultation bilaterally. Musculoskeletal: Normal bulk and tone.  No peripheral edema noted. Neurological: Is alert and oriented x4, no apparent focal deficits noted. Skin: Warm and dry.  No rash, erythema, lesions noted. Psychiatric: Normal mood and affect.   Assessment & Plan:   See Encounters Tab for problem based charting.  Patient discussed with Dr. Evette Doffing

## 2021-02-26 NOTE — Patient Instructions (Addendum)
Heidi Compton,  Fue un placer verte en la clnica. Hoy discutimos:  Prediabetes y colesterol: Contine con la dieta y el ejercicio como comentamos.  Dolor en las articulaciones: puede usar Voltaren gel cuatro veces al da segn sea necesario para Industrial/product designer. Por favor, hganos saber si esto contina.  Prueba de Papanicolaou realizada en esta visita: la llamar si hay resultados anormales.  Complete el papeleo de la tarjeta naranja y devulvalo lo antes posible  Si tiene alguna pregunta o inquietud, llame a nuestra clnica al [phone] [place] 9 a. m. y las 5 p. m. y despus del horario de atencin llame al [phone] y pregunte por el residente de medicina interna de [place]. Si cree que tiene Engineer, drilling, llame al 911.  Gracias, esperamos poder ayudarlo a mantenerse saludable!

## 2021-02-28 NOTE — Assessment & Plan Note (Signed)
Patient is following up her hyperlipidemia at this visit. She was previously prescribed atorvastatin 20mg  daily in setting of hyperlipidemia with LDL >190; however, reports that she does not take this currently. She endorses lifestyle modifications, especially with decrease of alcohol use from 12-pack of beer weekly to 1-2 beers on occasion.   Lipid Panel     Component Value Date/Time   CHOL 236 (H) 02/08/2021 1034   TRIG 168 (H) 02/08/2021 1034   HDL 42 02/08/2021 1034   CHOLHDL 5.6 (H) 02/08/2021 1034   CHOLHDL 5.2 09/29/2017 0942   VLDL 22 09/29/2017 0942   LDLCALC 163 (H) 02/08/2021 1034   LABVLDL 31 02/08/2021 1034   Assessment/Plan: Currently, LDL <190 and overall ASCVD risk 3.0%, low risk for MI/stroke in next 10 years.   Encouraged for ongoing lifestyle modification with decrease in fatty foods/fried foods and increase exercise to 02/10/2021 per week

## 2021-02-28 NOTE — Assessment & Plan Note (Signed)
Patient s/p right knee arthroscopy for acute medial meniscal tear in February 2022. Reports some ongoing knee pain in setting of surgery. MSK exam without limitations in range of motion of apparent deformity.   Plan: Advised for topical NSAID prn

## 2021-02-28 NOTE — Assessment & Plan Note (Signed)
Pap smear performed at this visit.  FIT testing negative 12/2020 - next due 12/2021

## 2021-02-28 NOTE — Assessment & Plan Note (Signed)
Hemoglobin A1c 6.4>6.2 this year. Patient provided education regarding lifestyle modifications to further improving glycemic control.   Plan: Repeat A1c in 1 year

## 2021-03-01 NOTE — Progress Notes (Signed)
Internal Medicine Clinic Attending ° °Case discussed with Dr. Aslam  At the time of the visit.  We reviewed the resident’s history and exam and pertinent patient test results.  I agree with the assessment, diagnosis, and plan of care documented in the resident’s note.  °

## 2021-03-03 LAB — CYTOLOGY - PAP
Adequacy: ABSENT
Comment: NEGATIVE
Diagnosis: NEGATIVE
High risk HPV: NEGATIVE

## 2021-03-04 ENCOUNTER — Encounter: Payer: Self-pay | Admitting: Internal Medicine

## 2021-03-26 ENCOUNTER — Telehealth: Payer: Self-pay

## 2021-03-26 NOTE — Telephone Encounter (Signed)
03/24/2021-Via Delorise Royals, Spanish Interpreter [facility]), Patient informed negative FIT Test results. Patient verbalized understanding.  ?

## 2021-06-28 ENCOUNTER — Telehealth: Payer: Self-pay

## 2021-06-28 NOTE — Telephone Encounter (Signed)
Health Coaching 3  interpreter- [name], [facility]   Goals- Patient has been making changes with diet. Patient has lost some weight recently. Patient walks for most of the day at her job. She is on her feet daily from 7-5. Patient works in a [employer] quality [employer]. Patient has been eating fruit and yogurt in the morning. Patient has been eating a salad and a portion of vegetables daily.    New goal-  Patient has been dealing with stress recently due to the loss of her brother. Patient states that her BP has been a little elevated when she checks it at home. Spoke with patient about trying some deep breathing or meditation exercises to help relax and calm her when she is feeling stressed.   Barrier to reaching goal-    Strategies to overcome-    Navigation:  Patient is aware of  a follow up session. Patient is scheduled for FU on 08/02/21 @ 3:00 pm.   Time-  10 minutes

## 2021-08-02 ENCOUNTER — Inpatient Hospital Stay: Payer: Self-pay | Attending: Obstetrics and Gynecology | Admitting: *Deleted

## 2021-08-02 VITALS — BP 142/98 | Ht 65.0 in | Wt 171.8 lb

## 2021-08-02 DIAGNOSIS — Z0000 Encounter for general adult medical examination without abnormal findings: Secondary | ICD-10-CM

## 2021-08-02 NOTE — Progress Notes (Signed)
[facility] follow up   Interpreter: [name], [employer]  Clinical Measurement:   Vitals:   08/02/21 1507  BP: (!) 140/98      Medical History:  Patient states that she has high cholesterol, does not have high blood pressure and she does not have diabetes.  Medications:  Patient states that she does not take medication to lower cholesterol, blood pressure and blood sugar.  Patient does not take an aspirin a day to help prevent a heart attack or stroke.    Blood pressure, self measurement: Patient states that she does measure blood pressure from home. She checks her blood pressure a few times per week. She shares her readings with a health care provider: no.   Nutrition: Patient states that on average she eats 2 cups of fruit and 1 cups of vegetables per day. Patient states that she does not eat fish at least 2 times per week. Patient eats less than half servings of whole grains. Patient drinks less than 36 ounces of beverages with added sugar weekly: yes. Patient is currently watching sodium or salt intake: yes. In the past 7 days patient has had 1 drinks containing alcohol. On average patient drinks 1 drinks containing alcohol per day.      Physical activity:  Patient states that she gets 0 minutes of moderate and 0 minutes of vigorous physical activity each week.  Smoking status:  Patient states that she has has never smoked .   Quality of life:  Over the past 2 weeks patient states that she had little interest or pleasure in doing things: several days. She has been feeling down, depressed or hopeless:several days.    Risk reduction and counseling:   Health Coaching:  Spoke to the patient about the daily recommendation for fruits and vegetables. Patient states that she does not like fish and does not consume it. Patient consumes oatmeal but no other whole grains. Gave suggestions for others that she can add into diet (whole wheat bread, whole grain cereals, whole wheat pasta and brown  rice). Patient has not been exercising recently. Patient states that she does walk all day at work.    Navigation: This was the  follow up session for this patient, I will check up on her progress in the coming months. Referred patient to [facility] for counseling services.   Time: 25 minutes

## 2021-08-09 ENCOUNTER — Ambulatory Visit (INDEPENDENT_AMBULATORY_CARE_PROVIDER_SITE_OTHER): Payer: Self-pay | Admitting: Internal Medicine

## 2021-08-09 ENCOUNTER — Other Ambulatory Visit (HOSPITAL_COMMUNITY): Payer: Self-pay

## 2021-08-09 VITALS — BP 137/71 | HR 75 | Wt 173.9 lb

## 2021-08-09 DIAGNOSIS — I10 Essential (primary) hypertension: Secondary | ICD-10-CM

## 2021-08-09 DIAGNOSIS — R7303 Prediabetes: Secondary | ICD-10-CM

## 2021-08-09 MED ORDER — LOSARTAN POTASSIUM 25 MG PO TABS
25.0000 mg | ORAL_TABLET | Freq: Every day | ORAL | 11 refills | Status: AC
Start: 2021-08-09 — End: 2022-08-09
  Filled 2021-08-09: qty 30, 30d supply, fill #1
  Filled 2021-08-09: qty 30, 30d supply, fill #0
  Filled 2021-09-10: qty 30, 30d supply, fill #1
  Filled 2021-10-12: qty 30, 30d supply, fill #2

## 2021-08-09 NOTE — Progress Notes (Signed)
   CC: HTN  HPI:  [name] is a 56 y.o. female with past medical history as detailed below who presents today for evaluation for increased BP readings. Please see problem-based charting for detailed assessment and plan.  Past Medical History:  Diagnosis Date   Acid reflux    Depression    Hyperlipemia    Urinary retention    Review of Systems:  Negative unless otherwise stated.  Physical Exam:  Vitals:   08/09/21 1401 08/09/21 1420  BP: 140/72 137/71  Pulse: 76 75  SpO2: 99%   Weight: 173 lb 14.4 oz (78.9 kg)    Constitutional:Well-appearing female in no acute distress. Cardio:Regular rate and rhythm. No murmurs, rubs, or gallops. Pulm:Clear to ausculation bilaterally. XKG:YJEHUDJS for extremity edema. Skin:Warm and dry. No rashes or lesions Neuro:Alert and oriented x3, no focal deficit noted. Psych:Pleasant mood and affect.  Assessment & Plan:   See Encounters Tab for problem based charting.  Hypertension Patient presents today due to recent visits noting elevated BP readings. Initial read today of 140/72, with recheck of 137/71. She at times has a pressure sensation in her head but when she checks her BP during these episodes it does not show SBP over 140.  Assessment:BP borderline uncontrolled, not on medical management at this time. Plan:Start losartan 25 mg daily. Patient will call with BP log in 2 weeks.  Prediabetes Patient reports increased activity particularly at her job where she walks all day, and chores/cleaning around the home. She continues to work towards a healthier diet as well but does admit to a lot of pressure over the last 2 months as her brother was very ill and ultimately passed away. She complains of intermittent tingling over her body but thinks that this is related to dehydration. She does not currently take medication for neuropathy. Denies polyuria, polydipsia.  Plan:Continue lifestyle modifications. Recheck HbA1c in  January.    Patient discussed with Dr. Mayford Knife

## 2021-08-09 NOTE — Assessment & Plan Note (Signed)
Patient presents today due to recent visits noting elevated BP readings. Initial read today of 140/72, with recheck of 137/71. She at times has a pressure sensation in her head but when she checks her BP during these episodes it does not show SBP over 140.  Assessment:BP borderline uncontrolled, not on medical management at this time. Plan:Start losartan 25 mg daily. Patient will call with BP log in 2 weeks.

## 2021-08-09 NOTE — Patient Instructions (Signed)
Heidi Compton,  Fue un placer atenderte hoy. Hablamos de su presin arterial, prediabetes y colesterol alto.  Me gustara que siguiera mejorando su dieta y mantenindose activo para ayudar a su pre-diabetes y niveles altos de colesterol. Volveremos a [employer] meses para ver cmo est mejorando estas condiciones sin tomar medicamentos.  Para su presin arterial, est justo en el nivel lmite de ser alta. Me gustara comenzar con un medicamento de dosis baja para ayudar a mantenerlo en un nivel normal llamado losartn. Tomar esto una vez al da. Asegrate de mantenerte hidratado!  Lo veremos nuevamente en la clnica en 2 semanas para volver a controlar su presin arterial con este nuevo medicamento. Asegrese de completar y enviar tambin la solicitud de la [facility].  Mi mejor,   --   [name],  It was a pleasure caring for you today. We discussed your blood pressure, pre-diabetes, and high cholesterol.  I would like you to continue improving your diet and staying active to help your pre-diabetes and high cholesterol levels. We will recheck these labs in a few months to see how you are doing in improving these conditions without taking medicine.  For your blood pressure, it is right at there borderline level for being high. I would like to start you on a Compton dose medicine to help keep it at a normal level called losartan. You will take this one time daily. Make sure to stay hydrated!  We will see you back in clinic in 2 weeks to recheck your blood pressure with this new medicine. Please make sure to fill out and submit the [facility] application as well.  My best, Dr. August Saucer

## 2021-08-09 NOTE — Assessment & Plan Note (Addendum)
Patient reports increased activity particularly at her job where she walks all day, and chores/cleaning around the home. She continues to work towards a healthier diet as well but does admit to a lot of pressure over the last 2 months as her brother was very ill and ultimately passed away. She complains of intermittent tingling over her body but thinks that this is related to dehydration. She does not currently take medication for neuropathy. Denies polyuria, polydipsia.  Plan:Continue lifestyle modifications. Recheck HbA1c in January.

## 2021-08-13 ENCOUNTER — Telehealth: Payer: Self-pay | Admitting: *Deleted

## 2021-08-13 NOTE — Telephone Encounter (Signed)
Patient brought a bill for date of service 02/26/2021 for a Pap smear to [facility]. Called patient and let her know that it is not covered by [facility]. Patient has a history of a hysterectomy for benign reasons, therefore doesn't need any further Pap smears per [facility] and [facility]CP guidelines. Let patient know about the [facility] Assistance. Patient stated she received the paperwork at that visit. Encouraged patient to complete the application. Patient verbalized understanding.  Patient was informed that she no longer needs Pap smears due to her history of a hysterectomy for benign reasons on 12/08/2020 at [facility] visit.

## 2021-08-23 ENCOUNTER — Ambulatory Visit (INDEPENDENT_AMBULATORY_CARE_PROVIDER_SITE_OTHER): Payer: Self-pay | Admitting: Internal Medicine

## 2021-08-23 DIAGNOSIS — I10 Essential (primary) hypertension: Secondary | ICD-10-CM

## 2021-08-23 NOTE — Assessment & Plan Note (Signed)
BP follow-up today with recordings since starting losartan 25 mg: 132/89, 128/88, 130/83, 137/94, 157/90 (while working outdoors in heat), 142/88 (while working outdoors in heat).  Plan:Continue losartan 25 mg daily. Will plan to follow up sometime in October to check BP. She will need a BMP at that time.

## 2021-08-23 NOTE — Progress Notes (Signed)
  [facility] Internal Medicine Residency Telephone Encounter Continuity Care Appointment  HPI:  This telephone encounter was created for [name] on 08/23/2021 for the following purpose/cc: BP follow-up.   Past Medical History:  Past Medical History:  Diagnosis Date   Acid reflux    Depression    Hyperlipemia    Urinary retention      ROS:  Negative unless otherwise stated.   Assessment / Plan / Recommendations:  Hypertension BP follow-up today with recordings since starting losartan 25 mg: 132/89, 128/88, 130/83, 137/94, 157/90 (while working outdoors in heat), 142/88 (while working outdoors in heat).  Plan:Continue losartan 25 mg daily. Will plan to follow up sometime in October to check BP. She will need a BMP at that time.  Plan: Please see A&P under problem oriented charting for assessment of the patient's acute and chronic medical conditions.  As always, pt is advised that if symptoms worsen or new symptoms arise, they should go to an urgent care facility or to to ER for further evaluation.   Consent and Medical Decision Making:  Patient discussed with Dr.  Lafonda Mosses This is a telephone encounter between [facility] and [facility] on 08/23/2021 for BP follow-up. The visit was conducted with the patient located at  work  and [facility] at [facility]. The patient's identity was confirmed using their DOB and current address. The patient has consented to being evaluated through a telephone encounter and understands the associated risks (an examination cannot be done and the patient may need to come in for an appointment) / benefits (allows the patient to remain at home, decreasing exposure to coronavirus). I personally spent 15 minutes on medical discussion.

## 2021-08-27 NOTE — Progress Notes (Signed)
Internal Medicine Clinic Attending ? ?Case discussed with Dr. Dean  At the time of the visit.  We reviewed the resident?s history and exam and pertinent patient test results.  I agree with the assessment, diagnosis, and plan of care documented in the resident?s note.  ?

## 2021-08-29 NOTE — Progress Notes (Signed)
Internal Medicine Clinic Attending ? ?Case discussed with Dr. Dean  At the time of the visit.  We reviewed the resident?s history and exam and pertinent patient test results.  I agree with the assessment, diagnosis, and plan of care documented in the resident?s note.  ?

## 2021-09-10 ENCOUNTER — Other Ambulatory Visit (HOSPITAL_COMMUNITY): Payer: Self-pay

## 2021-10-12 ENCOUNTER — Other Ambulatory Visit (HOSPITAL_COMMUNITY): Payer: Self-pay

## 2021-10-25 ENCOUNTER — Encounter: Payer: No Typology Code available for payment source | Admitting: Internal Medicine

## 2021-10-25 ENCOUNTER — Encounter: Payer: Self-pay | Admitting: Internal Medicine

## 2021-10-25 NOTE — Progress Notes (Deleted)
HTN: BP at goal today, *. Current regimen is losartan 25 mg daily.  HLD: LDL above goal, 163, when last checked 01/2021. She has been engaging in healthy lifestyle choices such as *.  PRE-DM: HbA1c 01/2021 of 6.2%. Patient has been engaging in healthy lifestyle choices such as *.  Hiv Colon--negative fit 03/2021 Shingrix flu

## 2021-12-16 ENCOUNTER — Ambulatory Visit
Admission: RE | Admit: 2021-12-16 | Discharge: 2021-12-16 | Disposition: A | Discharge status: Home / Self Care | Payer: No Typology Code available for payment source | Source: Ambulatory Visit | Attending: Obstetrics and Gynecology | Admitting: Obstetrics and Gynecology

## 2021-12-16 ENCOUNTER — Ambulatory Visit: Payer: Self-pay | Admitting: Hematology and Oncology

## 2021-12-16 ENCOUNTER — Other Ambulatory Visit: Payer: Self-pay

## 2021-12-16 VITALS — BP 170/102 | Wt 186.0 lb

## 2021-12-16 DIAGNOSIS — Z1231 Encounter for screening mammogram for malignant neoplasm of breast: Secondary | ICD-10-CM

## 2021-12-16 DIAGNOSIS — Z1211 Encounter for screening for malignant neoplasm of colon: Secondary | ICD-10-CM

## 2021-12-16 NOTE — Progress Notes (Signed)
[name] is a 56 y.o. female who presents to [facility] clinic today with no complaints .    Pap Smear: Pap not smear completed today. Patient had hysterectomy in 2007.   Physical exam: Breasts Breasts symmetrical. No skin abnormalities bilateral breasts. No nipple retraction bilateral breasts. No nipple discharge bilateral breasts. No lymphadenopathy. No lumps palpated bilateral breasts.     MS DIGITAL DIAG TOMO BILAT  Result Date: 12/08/2020 CLINICAL DATA:  Follow-up for probably benign calcifications in the LEFT breast. These probably benign calcifications were originally identified in November of 2020. EXAM: DIGITAL DIAGNOSTIC BILATERAL MAMMOGRAM WITH TOMOSYNTHESIS AND CAD TECHNIQUE: Bilateral digital diagnostic mammography and breast tomosynthesis was performed. The images were evaluated with computer-aided detection. COMPARISON:  Previous exam(s). ACR Breast Density Category c: The breast tissue is heterogeneously dense, which may obscure small masses. FINDINGS: The LEFT breast calcifications are stable, now shown to be stable for 2 years confirming benignity. There are no new dominant masses, suspicious calcifications or secondary signs of malignancy within either breast. IMPRESSION: No evidence of malignancy within either breast. Benign calcifications within the LEFT breast. Patient may return to routine annual bilateral screening mammogram schedule. RECOMMENDATION: Screening mammogram in one year.(Code:SM-B-01Y) I have discussed the findings and recommendations with the patient with the aid of an interpreter. If applicable, a reminder letter will be sent to the patient regarding the next appointment. BI-RADS CATEGORY  2: Benign. Electronically Signed   By: Bary Richard M.D.   On: 12/08/2020 14:59   MM DIAG BREAST TOMO BILATERAL  Result Date: 11/28/2019 CLINICAL DATA:  Follow-up for probably benign calcifications in the LEFT breast. These probably benign calcifications were  originally identified on screening mammogram dated 11/13/2018. EXAM: DIGITAL DIAGNOSTIC BILATERAL MAMMOGRAM WITH TOMO AND CAD COMPARISON:  Previous exams including diagnostic mammogram dated 05/27/2019. ACR Breast Density Category d: The breast tissue is extremely dense, which lowers the sensitivity of mammography. FINDINGS: There are no new dominant masses, suspicious calcifications or secondary signs of malignancy within either breast. The loosely grouped calcifications within the upper central LEFT breast are stable, favored to represent benign fibrocystic change. Mammographic images were processed with CAD. IMPRESSION: 1. Stable probably benign calcifications within the LEFT breast, suspected fibrocystic change. Recommend additional follow-up diagnostic mammogram in 12 months to ensure 2 year stability. 2. No evidence of malignancy within the RIGHT breast. RECOMMENDATION: Bilateral diagnostic mammogram, with LEFT breast magnification views, in 12 months. I have discussed the findings and recommendations with the patient. If applicable, a reminder letter will be sent to the patient regarding the next appointment. BI-RADS CATEGORY  3: Probably benign. Electronically Signed   By: Bary Richard M.D.   On: 11/28/2019 12:51   MS DIGITAL DIAG TOMO UNI LEFT  Result Date: 05/27/2019 CLINICAL DATA:  First six-month follow-up for probably benign LEFT breast calcifications. EXAM: DIGITAL DIAGNOSTIC UNILATERAL LEFT MAMMOGRAM WITH CAD AND TOMO COMPARISON:  11/26/2018 and earlier ACR Breast Density Category c: The breast tissue is heterogeneously dense, which may obscure small masses. FINDINGS: Magnified views are performed of calcifications in the central portion of the LEFT breast. Magnified calcifications are primarily punctate without suspicious layering or distribution. The appearance is stable. Note is also made of vascular calcifications. Mammographic images were processed with CAD. IMPRESSION: Stable, probably  benign LEFT breast calcifications. No mammographic evidence for malignancy. RECOMMENDATION: Bilateral diagnostic mammogram is recommended in 6 months. I have discussed the findings and recommendations with the patient with the assistance of an interpreter. If applicable,  a reminder letter will be sent to the patient regarding the next appointment. BI-RADS CATEGORY  3: Probably benign. Electronically Signed   By: Nolon Nations M.D.   On: 05/27/2019 12:04   MS DIGITAL DIAG UNI LEFT  Result Date: 11/26/2018 CLINICAL DATA:  56 year old female for further evaluation of LEFT breast calcifications identified on screening mammogram. EXAM: DIGITAL DIAGNOSTIC LEFT MAMMOGRAM WITH CAD COMPARISON:  Previous exam(s). ACR Breast Density Category d: The breast tissue is extremely dense, which lowers the sensitivity of mammography. FINDINGS: Full field and magnification views of the LEFT breast demonstrate a very loose group of primarily round calcifications within the UPPER central LEFT breast with suggestion of layering. These calcifications span is area measuring up to 3 cm. Other calcifications within the anterior UPPER LEFT breast are vascular. Mammographic images were processed with CAD. IMPRESSION: Likely benign LEFT breast calcifications. Six-month follow-up recommended to ensure stability. RECOMMENDATION: LEFT diagnostic mammogram with magnification views in 6 months. I have discussed the findings and recommendations with the patient. If applicable, a reminder letter will be sent to the patient regarding the next appointment. BI-RADS CATEGORY  3: Probably benign. Electronically Signed   By: Margarette Canada M.D.   On: 11/26/2018 14:03   MS DIGITAL SCREENING TOMO BILATERAL  Result Date: 11/14/2018 CLINICAL DATA:  Screening. EXAM: DIGITAL SCREENING BILATERAL MAMMOGRAM WITH TOMO AND CAD COMPARISON:  Previous exam(s). ACR Breast Density Category d: The breast tissue is extremely dense, which lowers the sensitivity of  mammography. FINDINGS: In the left breast, calcifications warrant further evaluation. In the right breast, no findings suspicious for malignancy. Images were processed with CAD. IMPRESSION: Further evaluation is suggested for calcifications in the left breast. RECOMMENDATION: Diagnostic mammogram of the left breast. (Code:[id number]) The patient will be contacted regarding the findings, and additional imaging will be scheduled. BI-RADS CATEGORY  0: Incomplete. Need additional imaging evaluation and/or prior mammograms for comparison. Electronically Signed   By: Kristopher Oppenheim M.D.   On: 11/14/2018 14:19   MM 3D SCREEN BREAST BILATERAL  Result Date: 09/21/2017 CLINICAL DATA:  Screening. EXAM: DIGITAL SCREENING BILATERAL MAMMOGRAM WITH TOMO AND CAD COMPARISON:  Previous exam(s). ACR Breast Density Category d: The breast tissue is extremely dense, which lowers the sensitivity of mammography. FINDINGS: There are no findings suspicious for malignancy. Images were processed with CAD. IMPRESSION: No mammographic evidence of malignancy. A result letter of this screening mammogram will be mailed directly to the patient. RECOMMENDATION: Screening mammogram in one year. (Code:[id number]) BI-RADS CATEGORY  1: Negative. Electronically Signed   By: Ammie Ferrier M.D.   On: 09/21/2017 14:54      Pelvic/Bimanual Pap is not indicated today    Smoking History: Patient has never smoked and was not referred to quit line.    Patient Navigation: Patient education provided. Access to services provided for patient through [place] interpreter provided. No transportation provided   Colorectal Cancer Screening: Per patient has never had colonoscopy completed No complaints today. FIT negative 2023.   Breast and Cervical Cancer Risk Assessment: Patient does not have family history of breast cancer, known genetic mutations, or radiation treatment to the chest before age 33. Patient does not have  history of cervical dysplasia, immunocompromised, or DES exposure in-utero.  Risk Scores as of 12/16/2021     [place] Janus           5-year 1 %   Lifetime 6.15 %   This patient is [place]/Lat[place] but has no documented birth country, so the [place]  model used data from [place] patients to calculate their risk score. Document a birth country in the Demographics activity for a more accurate score.         Last calculated by Claretha Cooper, CMA on 12/16/2021 at  2:16 PM        A: [facility] exam without pap smear No complaints with benign exam.   P: Referred patient to the [facility] for a screening mammogram. Appointment scheduled 12/16/21.  Dayton Scrape A, NP 12/16/2021 2:18 PM

## 2021-12-16 NOTE — Patient Instructions (Signed)
Taught Chace [place] Board about self breast awareness and gave educational materials to take home. Patient did not need a Pap smear today due to hysterectomy in 2007. [facility] for screening mammogram. Appointment scheduled for 12/16/21. Patient aware of appointment and will be there. Let patient know will follow up with her within the next couple weeks with results. Yoanna [employer] verbalized understanding.  Pascal Lux, NP 2:25 PM

## 2021-12-28 NOTE — Progress Notes (Signed)
FIT test has been mailed to the pt 12/28/2021. 

## 2022-02-08 LAB — FECAL OCCULT BLOOD, IMMUNOCHEMICAL: Fecal Occult Bld: NEGATIVE

## 2022-05-08 IMAGING — MG DIGITAL DIAGNOSTIC BILAT W/ TOMO W/ CAD
6 of 10 series · 6 of 26 positions shown · non-contrast
Comparison: Previous exams including diagnostic mammogram dated
05/27/2019.

CLINICAL DATA: Follow-up for probably benign calcifications in the
LEFT breast. These probably benign calcifications were originally
identified on screening mammogram dated 11/13/2018.

EXAM:
DIGITAL DIAGNOSTIC BILATERAL MAMMOGRAM WITH TOMO AND CAD

[L ML]
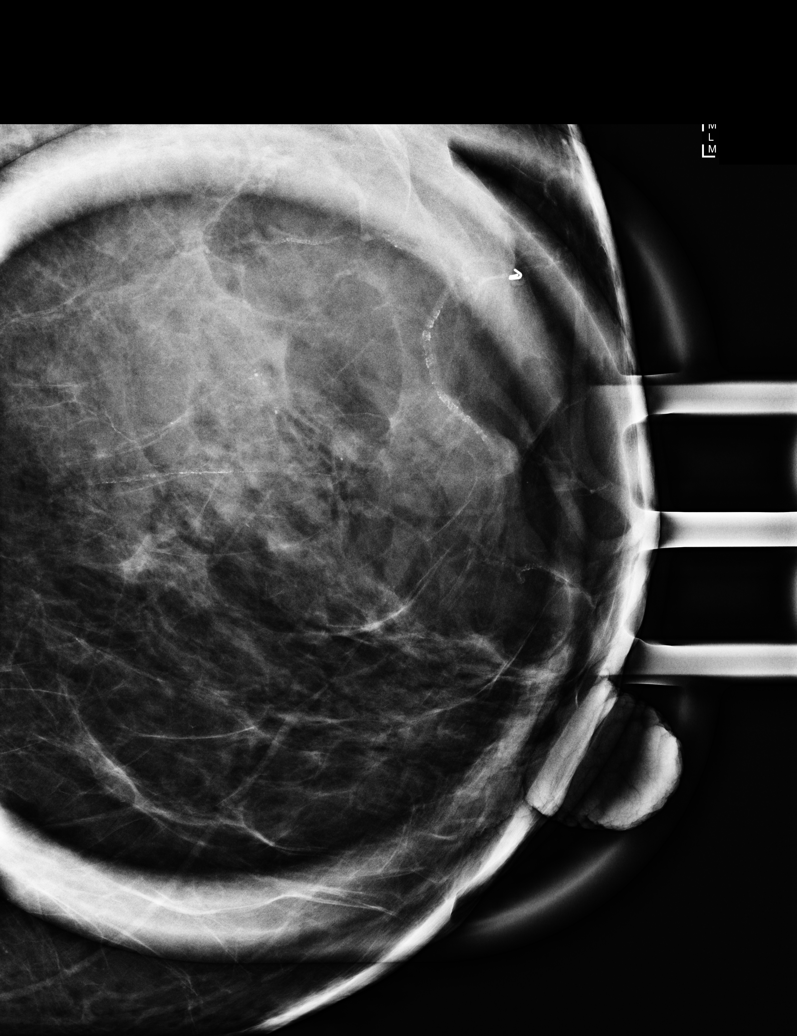

[L CC]
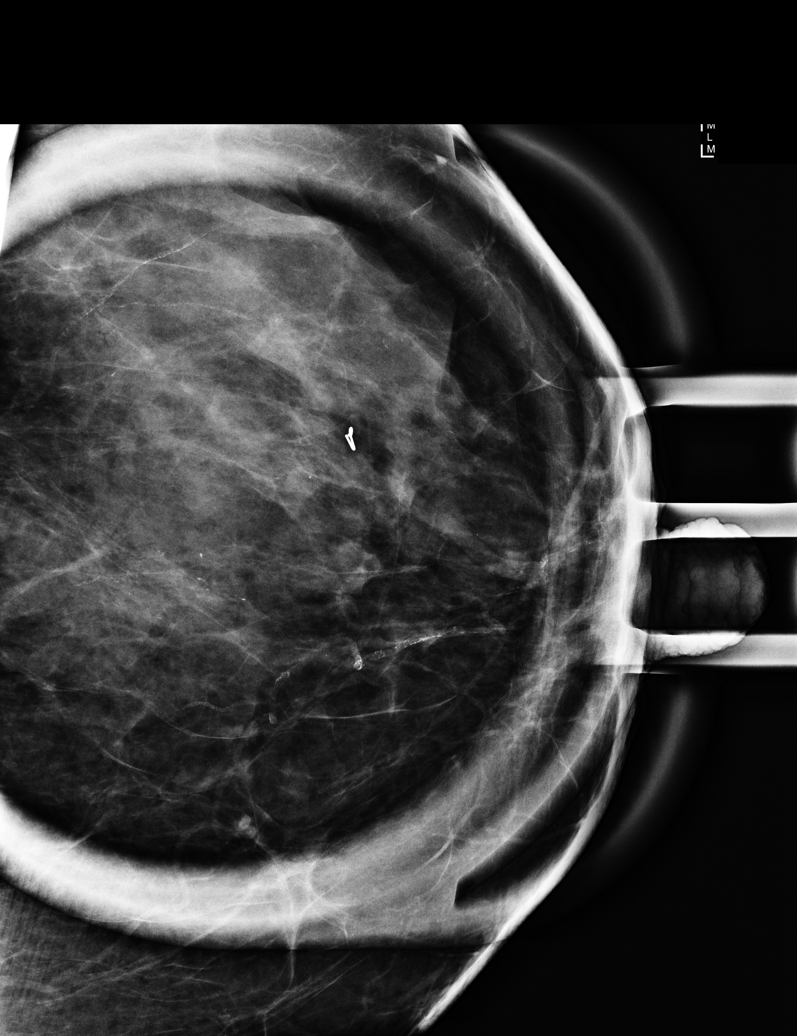

[R CC synth-2D]
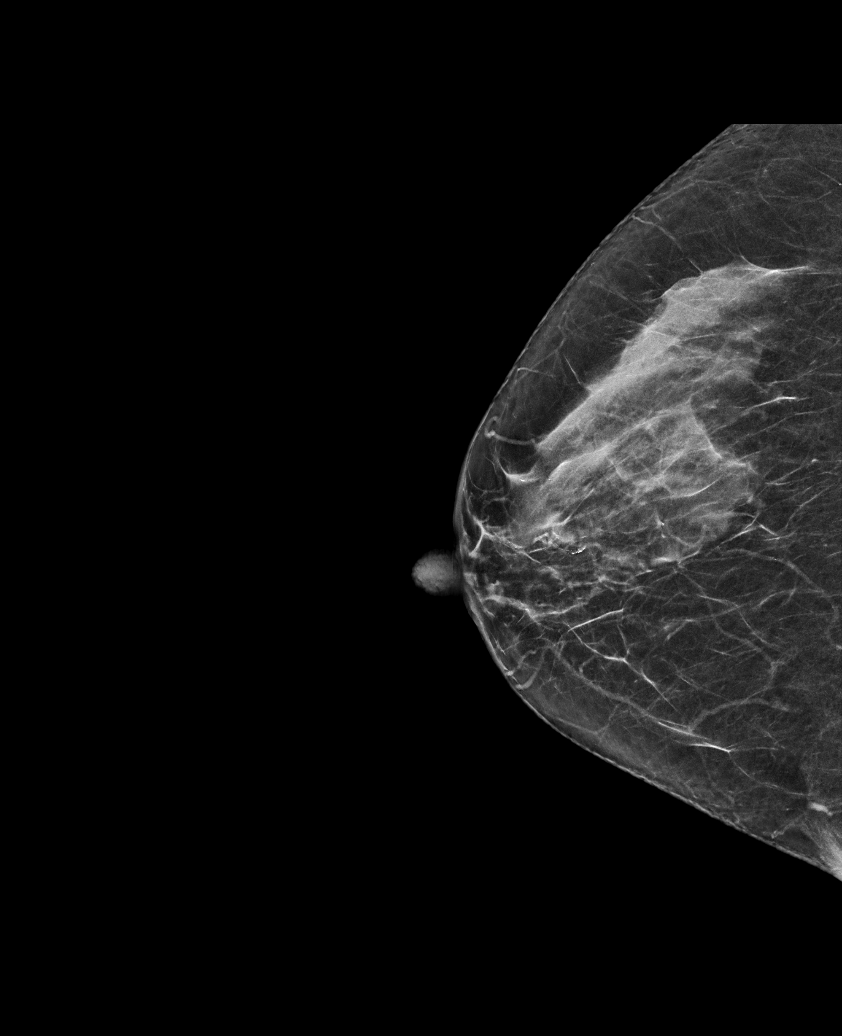

[L CC synth-2D]
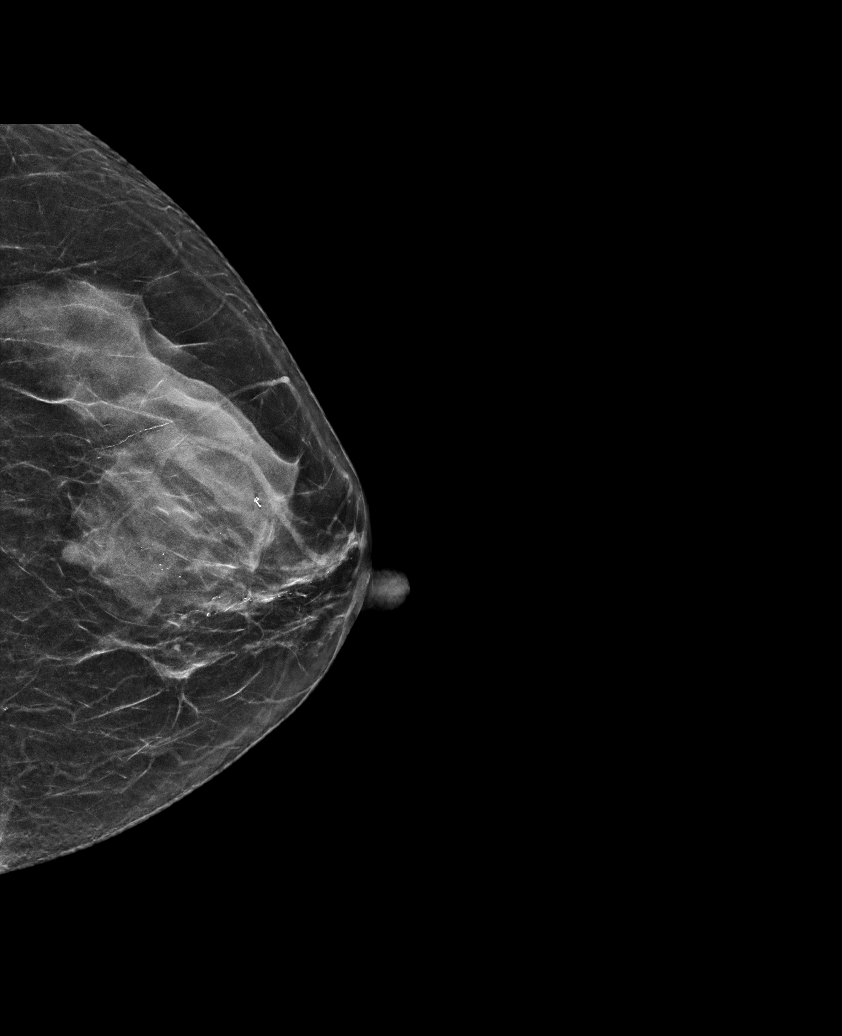

[R MLO synth-2D]
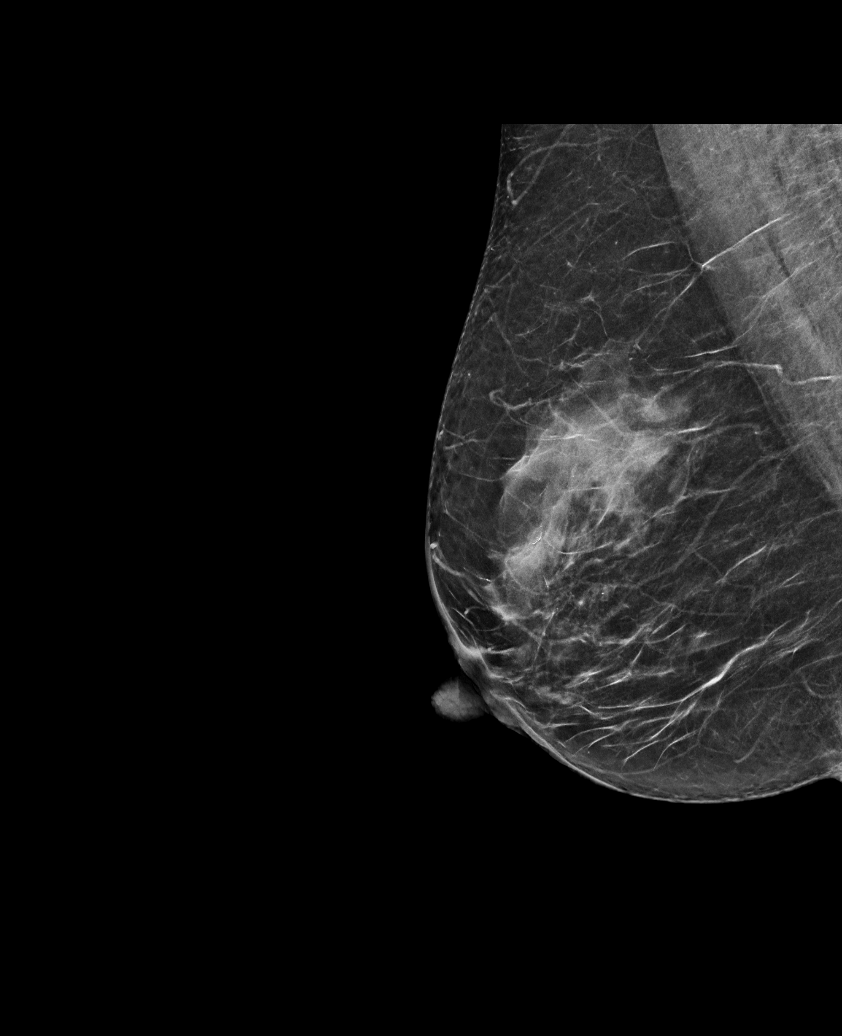

[L MLO synth-2D]
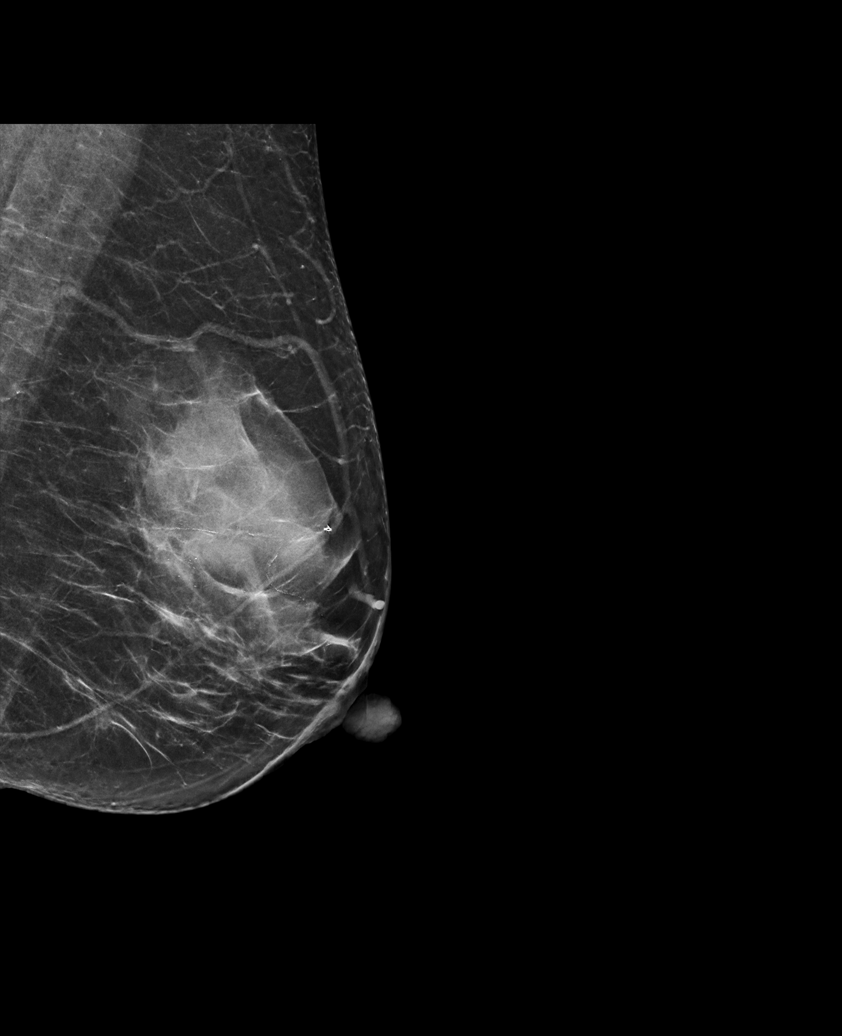

[6 of 26 positions shown; findings below may reference images not displayed]

ACR Breast Density Category d: The breast tissue is extremely dense,
which lowers the sensitivity of mammography.
FINDINGS: There are no new dominant masses, suspicious calcifications or
secondary signs of malignancy within either breast. The loosely
grouped calcifications within the upper central LEFT breast are
stable, favored to represent benign fibrocystic change.

Mammographic images were processed with CAD.
IMPRESSION: 1. Stable probably benign calcifications within the LEFT breast,
suspected fibrocystic change. Recommend additional follow-up
diagnostic mammogram in 12 months to ensure 2 year stability.
2. No evidence of malignancy within the RIGHT breast.

RECOMMENDATION:
Bilateral diagnostic mammogram, with LEFT breast magnification
views, in 12 months.

I have discussed the findings and recommendations with the patient.
If applicable, a reminder letter will be sent to the patient
regarding the next appointment.

BI-RADS CATEGORY  3: Probably benign.
